# Patient Record
Sex: Female | Born: 1970
Health system: Southern US, Community
[De-identification: ages and names within clinical notes are randomized; demographics above are authoritative.]

## PROBLEM LIST (undated history)

## (undated) DIAGNOSIS — N289 Disorder of kidney and ureter, unspecified: Secondary | ICD-10-CM

## (undated) DIAGNOSIS — E039 Hypothyroidism, unspecified: Secondary | ICD-10-CM

## (undated) HISTORY — DX: Hypothyroidism, unspecified: E03.9

---

## 1999-11-05 ENCOUNTER — Inpatient Hospital Stay (HOSPITAL_COMMUNITY): Admission: AD | Admit: 1999-11-05 | Discharge: 1999-11-05 | Payer: Self-pay | Admitting: Obstetrics and Gynecology

## 1999-11-29 ENCOUNTER — Inpatient Hospital Stay (HOSPITAL_COMMUNITY): Admission: AD | Admit: 1999-11-29 | Discharge: 1999-11-29 | Payer: Self-pay | Admitting: Obstetrics and Gynecology

## 1999-12-26 ENCOUNTER — Inpatient Hospital Stay (HOSPITAL_COMMUNITY): Admission: AD | Admit: 1999-12-26 | Discharge: 1999-12-26 | Payer: Self-pay | Admitting: *Deleted

## 1999-12-29 ENCOUNTER — Inpatient Hospital Stay (HOSPITAL_COMMUNITY): Admission: AD | Admit: 1999-12-29 | Discharge: 1999-12-29 | Payer: Self-pay | Admitting: Obstetrics and Gynecology

## 1999-12-31 ENCOUNTER — Inpatient Hospital Stay (HOSPITAL_COMMUNITY): Admission: AD | Admit: 1999-12-31 | Discharge: 1999-12-31 | Payer: Self-pay | Admitting: Obstetrics and Gynecology

## 2000-01-01 ENCOUNTER — Encounter (INDEPENDENT_AMBULATORY_CARE_PROVIDER_SITE_OTHER): Payer: Self-pay | Admitting: Specialist

## 2000-01-01 ENCOUNTER — Inpatient Hospital Stay (HOSPITAL_COMMUNITY): Admission: AD | Admit: 2000-01-01 | Discharge: 2000-01-03 | Payer: Self-pay | Admitting: Obstetrics and Gynecology

## 2000-02-05 ENCOUNTER — Other Ambulatory Visit: Admission: RE | Admit: 2000-02-05 | Discharge: 2000-02-05 | Payer: Self-pay | Admitting: Obstetrics and Gynecology

## 2001-03-10 ENCOUNTER — Other Ambulatory Visit: Admission: RE | Admit: 2001-03-10 | Discharge: 2001-03-10 | Payer: Self-pay | Admitting: Obstetrics and Gynecology

## 2002-03-31 ENCOUNTER — Other Ambulatory Visit: Admission: RE | Admit: 2002-03-31 | Discharge: 2002-03-31 | Payer: Self-pay | Admitting: Obstetrics and Gynecology

## 2003-06-06 ENCOUNTER — Other Ambulatory Visit: Admission: RE | Admit: 2003-06-06 | Discharge: 2003-06-06 | Payer: Self-pay | Admitting: Obstetrics and Gynecology

## 2004-08-28 ENCOUNTER — Other Ambulatory Visit: Admission: RE | Admit: 2004-08-28 | Discharge: 2004-08-28 | Payer: Self-pay | Admitting: Obstetrics and Gynecology

## 2005-10-20 ENCOUNTER — Other Ambulatory Visit: Admission: RE | Admit: 2005-10-20 | Discharge: 2005-10-20 | Payer: Self-pay | Admitting: Family Medicine

## 2006-07-21 ENCOUNTER — Inpatient Hospital Stay (HOSPITAL_COMMUNITY): Admission: RE | Admit: 2006-07-21 | Discharge: 2006-07-22 | Payer: Self-pay | Admitting: Orthopaedic Surgery

## 2006-10-06 HISTORY — PX: OTHER SURGICAL HISTORY: SHX169

## 2008-01-26 ENCOUNTER — Other Ambulatory Visit: Admission: RE | Admit: 2008-01-26 | Discharge: 2008-01-26 | Payer: Self-pay | Admitting: Family Medicine

## 2009-06-28 ENCOUNTER — Other Ambulatory Visit: Admission: RE | Admit: 2009-06-28 | Discharge: 2009-06-28 | Payer: Self-pay | Admitting: Family Medicine

## 2010-11-06 ENCOUNTER — Other Ambulatory Visit (HOSPITAL_COMMUNITY)
Admission: RE | Admit: 2010-11-06 | Discharge: 2010-11-06 | Disposition: A | Discharge status: Home / Self Care | Payer: BC Managed Care – PPO | Source: Ambulatory Visit | Attending: Family Medicine | Admitting: Family Medicine

## 2010-11-06 ENCOUNTER — Other Ambulatory Visit: Payer: Self-pay | Admitting: Family Medicine

## 2010-11-06 DIAGNOSIS — Z0000 Encounter for general adult medical examination without abnormal findings: Secondary | ICD-10-CM | POA: Insufficient documentation

## 2011-01-23 ENCOUNTER — Other Ambulatory Visit: Payer: Self-pay | Admitting: Family Medicine

## 2011-01-23 DIAGNOSIS — Z1231 Encounter for screening mammogram for malignant neoplasm of breast: Secondary | ICD-10-CM

## 2011-02-17 ENCOUNTER — Ambulatory Visit
Admission: RE | Admit: 2011-02-17 | Discharge: 2011-02-17 | Disposition: A | Discharge status: Home / Self Care | Payer: BC Managed Care – PPO | Source: Ambulatory Visit | Attending: Family Medicine | Admitting: Family Medicine

## 2011-02-17 DIAGNOSIS — Z1231 Encounter for screening mammogram for malignant neoplasm of breast: Secondary | ICD-10-CM

## 2011-02-21 NOTE — Op Note (Signed)
NAMEJANYLAH, [name]             ACCOUNT NO.:  [id number]   MEDICAL RECORD NO.:  [id number]          PATIENT TYPE:  INP   LOCATION:  5008                         FACILITY:  [facility]   PHYSICIAN:  Sharolyn Douglas, M.D.        DATE OF BIRTH:  1971/07/02   DATE OF PROCEDURE:  07/21/2006  DATE OF DISCHARGE:  07/22/2006                                 OPERATIVE REPORT   DIAGNOSES:  1. L5-S1 disk herniation with left lower extremity radiculopathy and      lateral recess narrowing.  2. L4-5 central disk herniation and facet arthropathy with left central      and lateral recess stenosis.   PROCEDURE:  1. Minimally invasive L5-S1 hemilaminotomy and diskectomy with lateral      recess decompression.  2. L4-5 minimally invasive hemilaminotomy, diskectomy and lateral recess      decompression.   SURGEON:  Sharolyn Douglas, M.D.   ASSISTANT:  Verlin Fester, P.A.   ANESTHESIA:  General endotracheal.   ESTIMATED BLOOD LOSS:  Minimal.   COMPLICATIONS:  None.   NEEDLE AND SPONGE COUNT:  Correct.   INDICATIONS:  The patient is a pleasant 40 year old female with severe left  lower extremity radiculopathy x6 weeks.  Her imaging studies show  degenerative disk at L4-5 and L5-S1.  At L5-S1 on the left, there is a large  disk rupture into the lateral recess with severe compression of the S1 nerve  root.  There is also associated facet hypertrophy and lateral recess  narrowing.  At L4-5, she has moderate central and lateral recess stenosis  due to a central and left disk herniation.  She has failed conservative  treatment measures and now elects to undergo two-level lumbar minimally  invasive decompression in hopes of improving her symptoms.  Risks, benefits,  alternatives were reviewed.   PROCEDURE:  After informed consent, she was taken to the operating room.  She underwent general endotracheal anesthesia without difficulty, given  prophylactic IV antibiotics, very carefully positioned prone onto the  Wilson  frame, all bony prominences padded, face and eyes protected all times, back  prepped and draped in the usual sterile fashion.  Fluoroscopy was brought  into the field and a 1-inch incision was made, centered between the L4-5 and  L5-S1 interspaces in the midline.  Dissection was carried through the deep  fascia.  The dilators from the [facility] retractor system were used to dock  onto the L5-S1 interspace and this was done under fluoroscopy.  The 50-mm  blades were attached to the MaXcess retractor and the retractor was gently  slid over the final dilator.  The retractor was attached to the operating  room table and then gently spread, dilating the paraspinal muscles.  We  visually confirmed the L5-S1 interface by taking a final fluoroscopic image.  The microscope was then draped and brought into the field.  The high-speed  bur was used to remove the inferior 1/3 of the L5 lamina and medial 1/3 of  the facet joint complex.  The ligamentum flavum was elevated.  We identified  the S1 nerve root, which was  being displaced dorsally by the large disk  rupture.  There were large epidural veins.  These were carefully dissected  and coagulated with bipolar.  We were able to gently mobilize the S1 nerve  root and then enter the disk herniation with a 15 blade.  Immediately, a  large fragment extruded; this was removed with a pituitary.  We then used  the Epstein curette to decompress the remaining disk herniation back into  the interspace.  This was removed as a large fragment.  We then removed  multiple small fragments.  We explored the spinal canal and felt that the  disk herniation had been decompressed.  We then undercut the superior facet  of S1, decompressing the lateral recess.  The S1 nerve root was now  completely free from its takeoff out medial to the S1 pedicle.  Hemostasis  was achieved.  We then turned our attention to performing a similar  procedure at L4-5.  We were able to  simply change the angle of the  microscope to access the L4-5 interspace.  A high-speed bur was again used  to remove the inferior 1/3 of the L4 lamina and medial 1/3 of the facet  joint complex.  At this time, the ligamentum flavum was not only elevated,  but removed.  We were able to undercut towards the midline, decompressing  some of the central stenosis.  We then identified the lateral border of the  thecal sac and immobilized it medially.  Again, there were large epidural  veins, which were coagulated.  We identified a broad-based bulging disk,  which was entered with the 15 blade.  We had disk material extruded under  pressure; this was removed and decompressed back into the disk space with  the Epstein; multiple fragments were removed.  The disk space was irrigated.  The lateral recess decompression was again completed.  We evaluated the L5  nerve root and felt that it was well decompressed.  Exploration of the  spinal canal revealed no free fragments.  Hemostasis was achieved.  The  wound was irrigated and 2 mL of fentanyl left in the laminotomies.  The  MaXcess retractor was removed.  The wound was then closed in layers with #1  Vicryl, 2-0 Vicryl and Dermabond on the skin edges.  The patient was turned  supine, extubated without difficulty and transferred to Recovery in stable  condition, able to move her upper and lower extremities.  It should be noted  that my assistant, Verlin Fester, P.A., was present throughout the procedure  including during positioning, the exposure.  She worked under the microscope  during the decompression and she also assisted with wound closure.      Sharolyn Douglas, M.D.  Electronically Signed     MC/MEDQ  D:  07/21/2006  T:  07/23/2006  Job:  [id number]

## 2012-04-16 ENCOUNTER — Other Ambulatory Visit: Payer: Self-pay | Admitting: Family Medicine

## 2012-04-16 DIAGNOSIS — Z1231 Encounter for screening mammogram for malignant neoplasm of breast: Secondary | ICD-10-CM

## 2012-04-30 ENCOUNTER — Ambulatory Visit: Payer: BC Managed Care – PPO

## 2012-10-11 ENCOUNTER — Ambulatory Visit
Admission: RE | Admit: 2012-10-11 | Discharge: 2012-10-11 | Disposition: A | Discharge status: Home / Self Care | Payer: BC Managed Care – PPO | Source: Ambulatory Visit | Attending: Family Medicine | Admitting: Family Medicine

## 2012-10-11 DIAGNOSIS — Z1231 Encounter for screening mammogram for malignant neoplasm of breast: Secondary | ICD-10-CM

## 2013-05-25 ENCOUNTER — Other Ambulatory Visit (HOSPITAL_COMMUNITY)
Admission: RE | Admit: 2013-05-25 | Discharge: 2013-05-25 | Disposition: A | Discharge status: Home / Self Care | Payer: BC Managed Care – PPO | Source: Ambulatory Visit | Attending: Family Medicine | Admitting: Family Medicine

## 2013-05-25 ENCOUNTER — Other Ambulatory Visit: Payer: Self-pay | Admitting: Physician Assistant

## 2013-05-25 DIAGNOSIS — Z0000 Encounter for general adult medical examination without abnormal findings: Secondary | ICD-10-CM | POA: Insufficient documentation

## 2015-04-06 ENCOUNTER — Ambulatory Visit (HOSPITAL_COMMUNITY)
Admission: RE | Admit: 2015-04-06 | Discharge: 2015-04-06 | Disposition: A | Discharge status: Home / Self Care | Payer: BLUE CROSS/BLUE SHIELD | Source: Ambulatory Visit | Attending: Cardiology | Admitting: Cardiology

## 2015-04-06 ENCOUNTER — Other Ambulatory Visit: Payer: Self-pay | Admitting: Cardiology

## 2015-04-06 DIAGNOSIS — M79609 Pain in unspecified limb: Secondary | ICD-10-CM

## 2015-04-10 ENCOUNTER — Other Ambulatory Visit: Payer: Self-pay

## 2015-04-10 DIAGNOSIS — Z1231 Encounter for screening mammogram for malignant neoplasm of breast: Secondary | ICD-10-CM

## 2015-04-13 ENCOUNTER — Ambulatory Visit: Payer: BLUE CROSS/BLUE SHIELD

## 2015-12-12 ENCOUNTER — Ambulatory Visit: Payer: BLUE CROSS/BLUE SHIELD

## 2015-12-26 ENCOUNTER — Ambulatory Visit
Admission: RE | Admit: 2015-12-26 | Discharge: 2015-12-26 | Disposition: A | Discharge status: Home / Self Care | Payer: BLUE CROSS/BLUE SHIELD | Source: Ambulatory Visit

## 2015-12-26 DIAGNOSIS — Z1231 Encounter for screening mammogram for malignant neoplasm of breast: Secondary | ICD-10-CM

## 2016-01-24 DIAGNOSIS — F4323 Adjustment disorder with mixed anxiety and depressed mood: Secondary | ICD-10-CM | POA: Diagnosis not present

## 2016-02-11 DIAGNOSIS — J069 Acute upper respiratory infection, unspecified: Secondary | ICD-10-CM | POA: Diagnosis not present

## 2016-04-29 DIAGNOSIS — M1711 Unilateral primary osteoarthritis, right knee: Secondary | ICD-10-CM | POA: Diagnosis not present

## 2016-05-06 DIAGNOSIS — M1711 Unilateral primary osteoarthritis, right knee: Secondary | ICD-10-CM | POA: Diagnosis not present

## 2016-05-13 DIAGNOSIS — M1711 Unilateral primary osteoarthritis, right knee: Secondary | ICD-10-CM | POA: Diagnosis not present

## 2016-06-25 DIAGNOSIS — M1711 Unilateral primary osteoarthritis, right knee: Secondary | ICD-10-CM | POA: Diagnosis not present

## 2016-09-03 DIAGNOSIS — L811 Chloasma: Secondary | ICD-10-CM | POA: Diagnosis not present

## 2016-09-03 DIAGNOSIS — Z7189 Other specified counseling: Secondary | ICD-10-CM | POA: Diagnosis not present

## 2016-09-03 DIAGNOSIS — Z23 Encounter for immunization: Secondary | ICD-10-CM | POA: Diagnosis not present

## 2016-09-17 DIAGNOSIS — Z23 Encounter for immunization: Secondary | ICD-10-CM | POA: Diagnosis not present

## 2016-11-28 ENCOUNTER — Other Ambulatory Visit: Payer: Self-pay | Admitting: Family Medicine

## 2016-11-28 DIAGNOSIS — Z1231 Encounter for screening mammogram for malignant neoplasm of breast: Secondary | ICD-10-CM

## 2016-12-26 ENCOUNTER — Ambulatory Visit
Admission: RE | Admit: 2016-12-26 | Discharge: 2016-12-26 | Disposition: A | Discharge status: Home / Self Care | Payer: BLUE CROSS/BLUE SHIELD | Source: Ambulatory Visit | Attending: Family Medicine | Admitting: Family Medicine

## 2016-12-26 DIAGNOSIS — Z1231 Encounter for screening mammogram for malignant neoplasm of breast: Secondary | ICD-10-CM

## 2017-03-03 DIAGNOSIS — Z23 Encounter for immunization: Secondary | ICD-10-CM | POA: Diagnosis not present

## 2017-05-28 DIAGNOSIS — D2272 Melanocytic nevi of left lower limb, including hip: Secondary | ICD-10-CM | POA: Diagnosis not present

## 2017-05-28 DIAGNOSIS — D225 Melanocytic nevi of trunk: Secondary | ICD-10-CM | POA: Diagnosis not present

## 2017-05-28 DIAGNOSIS — D239 Other benign neoplasm of skin, unspecified: Secondary | ICD-10-CM | POA: Diagnosis not present

## 2017-06-16 DIAGNOSIS — N951 Menopausal and female climacteric states: Secondary | ICD-10-CM | POA: Diagnosis not present

## 2017-06-16 DIAGNOSIS — N912 Amenorrhea, unspecified: Secondary | ICD-10-CM | POA: Diagnosis not present

## 2017-07-13 DIAGNOSIS — M7711 Lateral epicondylitis, right elbow: Secondary | ICD-10-CM | POA: Diagnosis not present

## 2017-08-04 DIAGNOSIS — M1711 Unilateral primary osteoarthritis, right knee: Secondary | ICD-10-CM | POA: Diagnosis not present

## 2017-08-12 DIAGNOSIS — M1711 Unilateral primary osteoarthritis, right knee: Secondary | ICD-10-CM | POA: Diagnosis not present

## 2017-09-21 DIAGNOSIS — M7711 Lateral epicondylitis, right elbow: Secondary | ICD-10-CM | POA: Diagnosis not present

## 2017-09-21 DIAGNOSIS — M25521 Pain in right elbow: Secondary | ICD-10-CM | POA: Diagnosis not present

## 2017-10-20 ENCOUNTER — Other Ambulatory Visit: Payer: Self-pay | Admitting: Family Medicine

## 2017-10-20 ENCOUNTER — Other Ambulatory Visit (HOSPITAL_COMMUNITY)
Admission: RE | Admit: 2017-10-20 | Discharge: 2017-10-20 | Disposition: A | Discharge status: Home / Self Care | Payer: BLUE CROSS/BLUE SHIELD | Source: Ambulatory Visit | Attending: Family Medicine | Admitting: Family Medicine

## 2017-10-20 DIAGNOSIS — Z01419 Encounter for gynecological examination (general) (routine) without abnormal findings: Secondary | ICD-10-CM | POA: Diagnosis not present

## 2017-10-20 DIAGNOSIS — Z23 Encounter for immunization: Secondary | ICD-10-CM | POA: Diagnosis not present

## 2017-10-20 DIAGNOSIS — Z0000 Encounter for general adult medical examination without abnormal findings: Secondary | ICD-10-CM | POA: Diagnosis not present

## 2017-10-20 DIAGNOSIS — Z124 Encounter for screening for malignant neoplasm of cervix: Secondary | ICD-10-CM | POA: Diagnosis not present

## 2017-10-22 LAB — CYTOLOGY - PAP
ADEQUACY: ABSENT
Diagnosis: NEGATIVE

## 2018-02-22 ENCOUNTER — Other Ambulatory Visit: Payer: Self-pay | Admitting: Family Medicine

## 2018-02-22 DIAGNOSIS — Z1231 Encounter for screening mammogram for malignant neoplasm of breast: Secondary | ICD-10-CM

## 2018-02-25 ENCOUNTER — Ambulatory Visit
Admission: RE | Admit: 2018-02-25 | Discharge: 2018-02-25 | Disposition: A | Discharge status: Home / Self Care | Payer: BLUE CROSS/BLUE SHIELD | Source: Ambulatory Visit | Attending: Family Medicine | Admitting: Family Medicine

## 2018-02-25 DIAGNOSIS — Z1231 Encounter for screening mammogram for malignant neoplasm of breast: Secondary | ICD-10-CM | POA: Diagnosis not present

## 2018-05-27 DIAGNOSIS — F411 Generalized anxiety disorder: Secondary | ICD-10-CM | POA: Diagnosis not present

## 2018-07-05 DIAGNOSIS — L738 Other specified follicular disorders: Secondary | ICD-10-CM | POA: Diagnosis not present

## 2018-07-05 DIAGNOSIS — Z23 Encounter for immunization: Secondary | ICD-10-CM | POA: Diagnosis not present

## 2018-07-05 DIAGNOSIS — D2271 Melanocytic nevi of right lower limb, including hip: Secondary | ICD-10-CM | POA: Diagnosis not present

## 2018-07-05 DIAGNOSIS — D2230 Melanocytic nevi of unspecified part of face: Secondary | ICD-10-CM | POA: Diagnosis not present

## 2018-08-11 DIAGNOSIS — F411 Generalized anxiety disorder: Secondary | ICD-10-CM | POA: Diagnosis not present

## 2018-08-25 DIAGNOSIS — F411 Generalized anxiety disorder: Secondary | ICD-10-CM | POA: Diagnosis not present

## 2018-09-07 DIAGNOSIS — M25561 Pain in right knee: Secondary | ICD-10-CM | POA: Diagnosis not present

## 2018-09-07 DIAGNOSIS — M1711 Unilateral primary osteoarthritis, right knee: Secondary | ICD-10-CM | POA: Diagnosis not present

## 2018-09-10 DIAGNOSIS — F411 Generalized anxiety disorder: Secondary | ICD-10-CM | POA: Diagnosis not present

## 2018-09-14 DIAGNOSIS — M1711 Unilateral primary osteoarthritis, right knee: Secondary | ICD-10-CM | POA: Diagnosis not present

## 2018-09-20 DIAGNOSIS — F411 Generalized anxiety disorder: Secondary | ICD-10-CM | POA: Diagnosis not present

## 2018-09-21 DIAGNOSIS — M1711 Unilateral primary osteoarthritis, right knee: Secondary | ICD-10-CM | POA: Diagnosis not present

## 2018-10-04 DIAGNOSIS — F411 Generalized anxiety disorder: Secondary | ICD-10-CM | POA: Diagnosis not present

## 2018-10-25 DIAGNOSIS — F411 Generalized anxiety disorder: Secondary | ICD-10-CM | POA: Diagnosis not present

## 2018-11-08 DIAGNOSIS — F411 Generalized anxiety disorder: Secondary | ICD-10-CM | POA: Diagnosis not present

## 2018-11-22 DIAGNOSIS — F411 Generalized anxiety disorder: Secondary | ICD-10-CM | POA: Diagnosis not present

## 2019-02-21 DIAGNOSIS — F411 Generalized anxiety disorder: Secondary | ICD-10-CM | POA: Diagnosis not present

## 2019-03-28 DIAGNOSIS — Z20828 Contact with and (suspected) exposure to other viral communicable diseases: Secondary | ICD-10-CM | POA: Diagnosis not present

## 2019-04-12 DIAGNOSIS — E7211 Homocystinuria: Secondary | ICD-10-CM | POA: Diagnosis not present

## 2019-04-12 DIAGNOSIS — M810 Age-related osteoporosis without current pathological fracture: Secondary | ICD-10-CM | POA: Diagnosis not present

## 2019-04-12 DIAGNOSIS — E559 Vitamin D deficiency, unspecified: Secondary | ICD-10-CM | POA: Diagnosis not present

## 2019-04-12 DIAGNOSIS — Z79890 Hormone replacement therapy (postmenopausal): Secondary | ICD-10-CM | POA: Diagnosis not present

## 2019-04-12 DIAGNOSIS — E039 Hypothyroidism, unspecified: Secondary | ICD-10-CM | POA: Diagnosis not present

## 2019-04-12 DIAGNOSIS — N951 Menopausal and female climacteric states: Secondary | ICD-10-CM | POA: Diagnosis not present

## 2019-04-13 DIAGNOSIS — M1711 Unilateral primary osteoarthritis, right knee: Secondary | ICD-10-CM | POA: Diagnosis not present

## 2019-04-18 DIAGNOSIS — F411 Generalized anxiety disorder: Secondary | ICD-10-CM | POA: Diagnosis not present

## 2019-05-12 ENCOUNTER — Other Ambulatory Visit: Payer: Self-pay | Admitting: Family Medicine

## 2019-05-12 DIAGNOSIS — Z1231 Encounter for screening mammogram for malignant neoplasm of breast: Secondary | ICD-10-CM

## 2019-05-17 ENCOUNTER — Ambulatory Visit
Admission: RE | Admit: 2019-05-17 | Discharge: 2019-05-17 | Disposition: A | Discharge status: Home / Self Care | Payer: BC Managed Care – PPO | Source: Ambulatory Visit | Attending: Family Medicine | Admitting: Family Medicine

## 2019-05-17 ENCOUNTER — Other Ambulatory Visit: Payer: Self-pay

## 2019-05-17 DIAGNOSIS — Z1231 Encounter for screening mammogram for malignant neoplasm of breast: Secondary | ICD-10-CM

## 2019-05-31 DIAGNOSIS — M1711 Unilateral primary osteoarthritis, right knee: Secondary | ICD-10-CM | POA: Diagnosis not present

## 2019-06-02 DIAGNOSIS — H10413 Chronic giant papillary conjunctivitis, bilateral: Secondary | ICD-10-CM | POA: Diagnosis not present

## 2019-06-02 DIAGNOSIS — H5213 Myopia, bilateral: Secondary | ICD-10-CM | POA: Diagnosis not present

## 2019-06-09 DIAGNOSIS — M1711 Unilateral primary osteoarthritis, right knee: Secondary | ICD-10-CM | POA: Diagnosis not present

## 2019-06-14 DIAGNOSIS — E230 Hypopituitarism: Secondary | ICD-10-CM | POA: Diagnosis not present

## 2019-06-16 DIAGNOSIS — M1711 Unilateral primary osteoarthritis, right knee: Secondary | ICD-10-CM | POA: Diagnosis not present

## 2019-06-20 DIAGNOSIS — F411 Generalized anxiety disorder: Secondary | ICD-10-CM | POA: Diagnosis not present

## 2019-06-28 DIAGNOSIS — Z23 Encounter for immunization: Secondary | ICD-10-CM | POA: Diagnosis not present

## 2019-08-09 DIAGNOSIS — E039 Hypothyroidism, unspecified: Secondary | ICD-10-CM | POA: Diagnosis not present

## 2019-08-09 DIAGNOSIS — Z79890 Hormone replacement therapy (postmenopausal): Secondary | ICD-10-CM | POA: Diagnosis not present

## 2019-08-09 DIAGNOSIS — M810 Age-related osteoporosis without current pathological fracture: Secondary | ICD-10-CM | POA: Diagnosis not present

## 2019-08-09 DIAGNOSIS — N951 Menopausal and female climacteric states: Secondary | ICD-10-CM | POA: Diagnosis not present

## 2019-08-22 ENCOUNTER — Other Ambulatory Visit: Payer: Self-pay | Admitting: Family Medicine

## 2019-08-22 ENCOUNTER — Other Ambulatory Visit (HOSPITAL_COMMUNITY)
Admission: RE | Admit: 2019-08-22 | Discharge: 2019-08-22 | Disposition: A | Discharge status: Home / Self Care | Payer: BC Managed Care – PPO | Source: Ambulatory Visit | Attending: Family Medicine | Admitting: Family Medicine

## 2019-08-22 DIAGNOSIS — Z0000 Encounter for general adult medical examination without abnormal findings: Secondary | ICD-10-CM | POA: Insufficient documentation

## 2019-08-22 DIAGNOSIS — Z13220 Encounter for screening for lipoid disorders: Secondary | ICD-10-CM | POA: Diagnosis not present

## 2019-08-24 LAB — CYTOLOGY - PAP
Comment: NEGATIVE
Diagnosis: NEGATIVE
High risk HPV: NEGATIVE

## 2019-09-20 DIAGNOSIS — F411 Generalized anxiety disorder: Secondary | ICD-10-CM | POA: Diagnosis not present

## 2019-09-26 DIAGNOSIS — F411 Generalized anxiety disorder: Secondary | ICD-10-CM | POA: Diagnosis not present

## 2019-10-05 DIAGNOSIS — M810 Age-related osteoporosis without current pathological fracture: Secondary | ICD-10-CM | POA: Diagnosis not present

## 2019-10-05 DIAGNOSIS — E039 Hypothyroidism, unspecified: Secondary | ICD-10-CM | POA: Diagnosis not present

## 2019-10-05 DIAGNOSIS — N951 Menopausal and female climacteric states: Secondary | ICD-10-CM | POA: Diagnosis not present

## 2019-10-05 DIAGNOSIS — Z79890 Hormone replacement therapy (postmenopausal): Secondary | ICD-10-CM | POA: Diagnosis not present

## 2019-10-10 DIAGNOSIS — F411 Generalized anxiety disorder: Secondary | ICD-10-CM | POA: Diagnosis not present

## 2019-10-24 DIAGNOSIS — F411 Generalized anxiety disorder: Secondary | ICD-10-CM | POA: Diagnosis not present

## 2019-11-07 DIAGNOSIS — F411 Generalized anxiety disorder: Secondary | ICD-10-CM | POA: Diagnosis not present

## 2019-11-23 DIAGNOSIS — Z20828 Contact with and (suspected) exposure to other viral communicable diseases: Secondary | ICD-10-CM | POA: Diagnosis not present

## 2019-11-23 DIAGNOSIS — Z03818 Encounter for observation for suspected exposure to other biological agents ruled out: Secondary | ICD-10-CM | POA: Diagnosis not present

## 2019-12-02 DIAGNOSIS — N951 Menopausal and female climacteric states: Secondary | ICD-10-CM | POA: Diagnosis not present

## 2019-12-02 DIAGNOSIS — M810 Age-related osteoporosis without current pathological fracture: Secondary | ICD-10-CM | POA: Diagnosis not present

## 2019-12-02 DIAGNOSIS — Z79890 Hormone replacement therapy (postmenopausal): Secondary | ICD-10-CM | POA: Diagnosis not present

## 2019-12-05 DIAGNOSIS — F411 Generalized anxiety disorder: Secondary | ICD-10-CM | POA: Diagnosis not present

## 2019-12-10 ENCOUNTER — Ambulatory Visit: Payer: BC Managed Care – PPO | Attending: Internal Medicine

## 2019-12-10 DIAGNOSIS — Z23 Encounter for immunization: Secondary | ICD-10-CM

## 2019-12-10 NOTE — Progress Notes (Signed)
   Covid-19 Vaccination Clinic  Name:  [name]    MRN: [id number] DOB: 08/20/1971  12/10/2019  [name] was observed post Covid-19 immunization for 15 minutes without incident. She was provided with Vaccine Information Sheet and instruction to access the V-Safe system.   [name] was instructed to call 911 with any severe reactions post vaccine: Marland Kitchen Difficulty breathing  . Swelling of face and throat  . A fast heartbeat  . A bad rash all over body  . Dizziness and weakness   Immunizations Administered    Name Date Dose VIS Date Route   Pfizer COVID-19 Vaccine 12/10/2019 10:56 AM 0.3 mL 09/16/2019 Intramuscular   Manufacturer: [employer], [employer]   Lot: [id number]   NDC: [address]-5

## 2019-12-20 DIAGNOSIS — F411 Generalized anxiety disorder: Secondary | ICD-10-CM | POA: Diagnosis not present

## 2019-12-26 DIAGNOSIS — F411 Generalized anxiety disorder: Secondary | ICD-10-CM | POA: Diagnosis not present

## 2019-12-31 ENCOUNTER — Ambulatory Visit: Payer: BC Managed Care – PPO | Attending: Internal Medicine

## 2019-12-31 DIAGNOSIS — Z23 Encounter for immunization: Secondary | ICD-10-CM

## 2019-12-31 NOTE — Progress Notes (Signed)
   Covid-19 Vaccination Clinic  Name:  SHATERRA SANZONE    MRN: [id number] DOB: [birth year]  12/31/2019  Ms. Amador was observed post Covid-19 immunization for 15 minutes without incident. She was provided with Vaccine Information Sheet and instruction to access the V-Safe system.   Ms. Delavega was instructed to call 911 with any severe reactions post vaccine: Marland Kitchen Difficulty breathing  . Swelling of face and throat  . A fast heartbeat  . A bad rash all over body  . Dizziness and weakness   Immunizations Administered    Name Date Dose VIS Date Route   Pfizer COVID-19 Vaccine 12/31/2019  8:23 AM 0.3 mL 09/16/2019 Intramuscular   Manufacturer: [employer], [employer]   Lot: [id number]   NDC: [address]-3

## 2020-01-16 DIAGNOSIS — F411 Generalized anxiety disorder: Secondary | ICD-10-CM | POA: Diagnosis not present

## 2020-01-23 DIAGNOSIS — Z79890 Hormone replacement therapy (postmenopausal): Secondary | ICD-10-CM | POA: Diagnosis not present

## 2020-01-23 DIAGNOSIS — N951 Menopausal and female climacteric states: Secondary | ICD-10-CM | POA: Diagnosis not present

## 2020-01-23 DIAGNOSIS — M810 Age-related osteoporosis without current pathological fracture: Secondary | ICD-10-CM | POA: Diagnosis not present

## 2020-02-06 DIAGNOSIS — F411 Generalized anxiety disorder: Secondary | ICD-10-CM | POA: Diagnosis not present

## 2020-02-27 DIAGNOSIS — F411 Generalized anxiety disorder: Secondary | ICD-10-CM | POA: Diagnosis not present

## 2020-04-03 DIAGNOSIS — N951 Menopausal and female climacteric states: Secondary | ICD-10-CM | POA: Diagnosis not present

## 2020-04-03 DIAGNOSIS — M810 Age-related osteoporosis without current pathological fracture: Secondary | ICD-10-CM | POA: Diagnosis not present

## 2020-04-03 DIAGNOSIS — Z79890 Hormone replacement therapy (postmenopausal): Secondary | ICD-10-CM | POA: Diagnosis not present

## 2020-05-21 DIAGNOSIS — F411 Generalized anxiety disorder: Secondary | ICD-10-CM | POA: Diagnosis not present

## 2020-06-04 DIAGNOSIS — F411 Generalized anxiety disorder: Secondary | ICD-10-CM | POA: Diagnosis not present

## 2020-06-06 DIAGNOSIS — M79671 Pain in right foot: Secondary | ICD-10-CM | POA: Diagnosis not present

## 2020-06-06 DIAGNOSIS — M170 Bilateral primary osteoarthritis of knee: Secondary | ICD-10-CM | POA: Diagnosis not present

## 2020-06-18 DIAGNOSIS — F411 Generalized anxiety disorder: Secondary | ICD-10-CM | POA: Diagnosis not present

## 2020-06-22 DIAGNOSIS — N951 Menopausal and female climacteric states: Secondary | ICD-10-CM | POA: Diagnosis not present

## 2020-06-22 DIAGNOSIS — Z79890 Hormone replacement therapy (postmenopausal): Secondary | ICD-10-CM | POA: Diagnosis not present

## 2020-06-22 DIAGNOSIS — M810 Age-related osteoporosis without current pathological fracture: Secondary | ICD-10-CM | POA: Diagnosis not present

## 2020-07-02 ENCOUNTER — Other Ambulatory Visit: Payer: Self-pay | Admitting: Family Medicine

## 2020-07-02 DIAGNOSIS — F411 Generalized anxiety disorder: Secondary | ICD-10-CM | POA: Diagnosis not present

## 2020-07-02 DIAGNOSIS — Z1231 Encounter for screening mammogram for malignant neoplasm of breast: Secondary | ICD-10-CM

## 2020-07-03 ENCOUNTER — Other Ambulatory Visit: Payer: Self-pay

## 2020-07-03 ENCOUNTER — Ambulatory Visit
Admission: RE | Admit: 2020-07-03 | Discharge: 2020-07-03 | Disposition: A | Discharge status: Home / Self Care | Payer: BC Managed Care – PPO | Source: Ambulatory Visit | Attending: Family Medicine | Admitting: Family Medicine

## 2020-07-03 DIAGNOSIS — Z1231 Encounter for screening mammogram for malignant neoplasm of breast: Secondary | ICD-10-CM | POA: Diagnosis not present

## 2020-07-04 DIAGNOSIS — M1711 Unilateral primary osteoarthritis, right knee: Secondary | ICD-10-CM | POA: Diagnosis not present

## 2020-07-05 DIAGNOSIS — H524 Presbyopia: Secondary | ICD-10-CM | POA: Diagnosis not present

## 2020-07-05 DIAGNOSIS — H10413 Chronic giant papillary conjunctivitis, bilateral: Secondary | ICD-10-CM | POA: Diagnosis not present

## 2020-07-05 DIAGNOSIS — H5213 Myopia, bilateral: Secondary | ICD-10-CM | POA: Diagnosis not present

## 2020-07-11 DIAGNOSIS — M1711 Unilateral primary osteoarthritis, right knee: Secondary | ICD-10-CM | POA: Diagnosis not present

## 2020-07-16 DIAGNOSIS — F411 Generalized anxiety disorder: Secondary | ICD-10-CM | POA: Diagnosis not present

## 2020-07-18 DIAGNOSIS — M1711 Unilateral primary osteoarthritis, right knee: Secondary | ICD-10-CM | POA: Diagnosis not present

## 2020-08-06 DIAGNOSIS — F411 Generalized anxiety disorder: Secondary | ICD-10-CM | POA: Diagnosis not present

## 2020-08-27 DIAGNOSIS — F411 Generalized anxiety disorder: Secondary | ICD-10-CM | POA: Diagnosis not present

## 2020-09-10 DIAGNOSIS — F411 Generalized anxiety disorder: Secondary | ICD-10-CM | POA: Diagnosis not present

## 2020-09-24 DIAGNOSIS — F411 Generalized anxiety disorder: Secondary | ICD-10-CM | POA: Diagnosis not present

## 2020-10-08 DIAGNOSIS — E039 Hypothyroidism, unspecified: Secondary | ICD-10-CM | POA: Diagnosis not present

## 2020-10-08 DIAGNOSIS — M810 Age-related osteoporosis without current pathological fracture: Secondary | ICD-10-CM | POA: Diagnosis not present

## 2020-10-08 DIAGNOSIS — E559 Vitamin D deficiency, unspecified: Secondary | ICD-10-CM | POA: Diagnosis not present

## 2020-10-08 DIAGNOSIS — Z79890 Hormone replacement therapy (postmenopausal): Secondary | ICD-10-CM | POA: Diagnosis not present

## 2020-10-08 DIAGNOSIS — N951 Menopausal and female climacteric states: Secondary | ICD-10-CM | POA: Diagnosis not present

## 2020-10-25 DIAGNOSIS — Z23 Encounter for immunization: Secondary | ICD-10-CM | POA: Diagnosis not present

## 2020-10-25 DIAGNOSIS — Z13220 Encounter for screening for lipoid disorders: Secondary | ICD-10-CM | POA: Diagnosis not present

## 2020-10-25 DIAGNOSIS — Z0000 Encounter for general adult medical examination without abnormal findings: Secondary | ICD-10-CM | POA: Diagnosis not present

## 2021-01-24 DIAGNOSIS — M810 Age-related osteoporosis without current pathological fracture: Secondary | ICD-10-CM | POA: Diagnosis not present

## 2021-01-24 DIAGNOSIS — Z79890 Hormone replacement therapy (postmenopausal): Secondary | ICD-10-CM | POA: Diagnosis not present

## 2021-01-24 DIAGNOSIS — N951 Menopausal and female climacteric states: Secondary | ICD-10-CM | POA: Diagnosis not present

## 2021-01-31 DIAGNOSIS — D124 Benign neoplasm of descending colon: Secondary | ICD-10-CM | POA: Diagnosis not present

## 2021-01-31 DIAGNOSIS — K621 Rectal polyp: Secondary | ICD-10-CM | POA: Diagnosis not present

## 2021-01-31 DIAGNOSIS — K5730 Diverticulosis of large intestine without perforation or abscess without bleeding: Secondary | ICD-10-CM | POA: Diagnosis not present

## 2021-01-31 DIAGNOSIS — K644 Residual hemorrhoidal skin tags: Secondary | ICD-10-CM | POA: Diagnosis not present

## 2021-01-31 DIAGNOSIS — Z1211 Encounter for screening for malignant neoplasm of colon: Secondary | ICD-10-CM | POA: Diagnosis not present

## 2021-01-31 DIAGNOSIS — D122 Benign neoplasm of ascending colon: Secondary | ICD-10-CM | POA: Diagnosis not present

## 2021-01-31 DIAGNOSIS — D123 Benign neoplasm of transverse colon: Secondary | ICD-10-CM | POA: Diagnosis not present

## 2021-01-31 DIAGNOSIS — K648 Other hemorrhoids: Secondary | ICD-10-CM | POA: Diagnosis not present

## 2021-02-03 DIAGNOSIS — K5792 Diverticulitis of intestine, part unspecified, without perforation or abscess without bleeding: Secondary | ICD-10-CM

## 2021-02-03 HISTORY — DX: Diverticulitis of intestine, part unspecified, without perforation or abscess without bleeding: K57.92

## 2021-04-21 ENCOUNTER — Encounter (HOSPITAL_COMMUNITY): Payer: Self-pay

## 2021-04-21 ENCOUNTER — Emergency Department (HOSPITAL_COMMUNITY)
Admission: EM | Admit: 2021-04-21 | Discharge: 2021-04-22 | Disposition: A | Discharge status: Home / Self Care | Payer: BC Managed Care – PPO | Attending: Emergency Medicine | Admitting: Emergency Medicine

## 2021-04-21 ENCOUNTER — Other Ambulatory Visit: Payer: Self-pay

## 2021-04-21 DIAGNOSIS — K5732 Diverticulitis of large intestine without perforation or abscess without bleeding: Secondary | ICD-10-CM | POA: Insufficient documentation

## 2021-04-21 DIAGNOSIS — K5792 Diverticulitis of intestine, part unspecified, without perforation or abscess without bleeding: Secondary | ICD-10-CM | POA: Diagnosis not present

## 2021-04-21 DIAGNOSIS — R109 Unspecified abdominal pain: Secondary | ICD-10-CM | POA: Diagnosis not present

## 2021-04-21 DIAGNOSIS — R1030 Lower abdominal pain, unspecified: Secondary | ICD-10-CM | POA: Diagnosis not present

## 2021-04-21 HISTORY — DX: Disorder of kidney and ureter, unspecified: N28.9

## 2021-04-21 NOTE — ED Triage Notes (Signed)
Patient arrived with complaints of lower abdominal pain that started yesterday but states she has been feeling bloated since Friday. Reports taking a stool softener and able to have multiple bowel movements but no relief from pain.

## 2021-04-22 ENCOUNTER — Emergency Department (HOSPITAL_COMMUNITY): Payer: BC Managed Care – PPO

## 2021-04-22 ENCOUNTER — Encounter (HOSPITAL_COMMUNITY): Payer: Self-pay

## 2021-04-22 DIAGNOSIS — R109 Unspecified abdominal pain: Secondary | ICD-10-CM | POA: Diagnosis not present

## 2021-04-22 DIAGNOSIS — K5792 Diverticulitis of intestine, part unspecified, without perforation or abscess without bleeding: Secondary | ICD-10-CM | POA: Diagnosis not present

## 2021-04-22 LAB — CBC
HCT: 40.5 % (ref 36.0–46.0)
Hemoglobin: 13.7 g/dL (ref 12.0–15.0)
MCH: 32.1 pg (ref 26.0–34.0)
MCHC: 33.8 g/dL (ref 30.0–36.0)
MCV: 94.8 fL (ref 80.0–100.0)
Platelets: 245 10*3/uL (ref 150–400)
RBC: 4.27 MIL/uL (ref 3.87–5.11)
RDW: 13 % (ref 11.5–15.5)
WBC: 12.2 10*3/uL — ABNORMAL HIGH (ref 4.0–10.5)
nRBC: 0 % (ref 0.0–0.2)

## 2021-04-22 LAB — COMPREHENSIVE METABOLIC PANEL
ALT: 18 U/L (ref 0–44)
AST: 27 U/L (ref 15–41)
Albumin: 4.5 g/dL (ref 3.5–5.0)
Alkaline Phosphatase: 71 U/L (ref 38–126)
Anion gap: 9 (ref 5–15)
BUN: 17 mg/dL (ref 6–20)
CO2: 23 mmol/L (ref 22–32)
Calcium: 9.2 mg/dL (ref 8.9–10.3)
Chloride: 102 mmol/L (ref 98–111)
Creatinine, Ser: 1.03 mg/dL — ABNORMAL HIGH (ref 0.44–1.00)
GFR, Estimated: >60 mL/min (ref >60)
Glucose, Bld: 129 mg/dL — ABNORMAL HIGH (ref 70–99)
Potassium: 3.4 mmol/L — ABNORMAL LOW (ref 3.5–5.1)
Sodium: 134 mmol/L — ABNORMAL LOW (ref 135–145)
Total Bilirubin: 0.5 mg/dL (ref 0.3–1.2)
Total Protein: 7.9 g/dL (ref 6.5–8.1)

## 2021-04-22 LAB — LIPASE, BLOOD: Lipase: 44 U/L (ref 11–51)

## 2021-04-22 LAB — LACTIC ACID, PLASMA: Lactic Acid, Venous: 1.3 mmol/L (ref 0.5–1.9)

## 2021-04-22 MED ORDER — IOHEXOL 350 MG/ML SOLN
80.0000 mL | Freq: Once | INTRAVENOUS | Status: AC | PRN
  Administered 2021-04-22: 80 mL via INTRAVENOUS

## 2021-04-22 MED ORDER — HYDROMORPHONE HCL 1 MG/ML IJ SOLN
1.0000 mg | Freq: Once | INTRAMUSCULAR | Status: AC
  Administered 2021-04-22: 1 mg via INTRAVENOUS
  Filled 2021-04-22: qty 1

## 2021-04-22 MED ORDER — ONDANSETRON 4 MG PO TBDP: 4.0000 mg | ORAL_TABLET | Freq: Three times a day (TID) | ORAL | 0 refills | Status: DC | PRN

## 2021-04-22 MED ORDER — HYDROCODONE-ACETAMINOPHEN 5-325 MG PO TABS: 1.0000 | ORAL_TABLET | ORAL | 0 refills | Status: DC | PRN

## 2021-04-22 MED ORDER — METRONIDAZOLE 500 MG/100ML IV SOLN
500.0000 mg | Freq: Once | INTRAVENOUS | Status: AC
  Administered 2021-04-22: 500 mg via INTRAVENOUS
  Filled 2021-04-22: qty 100

## 2021-04-22 MED ORDER — OXYCODONE-ACETAMINOPHEN 5-325 MG PO TABS
1.0000 | ORAL_TABLET | Freq: Once | ORAL | Status: AC
  Administered 2021-04-22: 1 via ORAL
  Filled 2021-04-22: qty 1

## 2021-04-22 MED ORDER — AMOXICILLIN-POT CLAVULANATE 875-125 MG PO TABS: 1.0000 | ORAL_TABLET | Freq: Two times a day (BID) | ORAL | 0 refills | Status: DC

## 2021-04-22 MED ORDER — SODIUM CHLORIDE 0.9 % IV BOLUS
1000.0000 mL | Freq: Once | INTRAVENOUS | Status: AC
  Administered 2021-04-22: 1000 mL via INTRAVENOUS

## 2021-04-22 MED ORDER — SODIUM CHLORIDE 0.9 % IV SOLN
1.0000 g | Freq: Once | INTRAVENOUS | Status: AC
  Administered 2021-04-22: 1 g via INTRAVENOUS
  Filled 2021-04-22: qty 10

## 2021-04-22 MED ORDER — ONDANSETRON HCL 4 MG/2ML IJ SOLN
4.0000 mg | Freq: Once | INTRAMUSCULAR | Status: AC
  Administered 2021-04-22: 4 mg via INTRAVENOUS
  Filled 2021-04-22: qty 2

## 2021-04-22 NOTE — ED Notes (Signed)
Patient provided with ginger ale for PO challenge.

## 2021-04-22 NOTE — ED Provider Notes (Signed)
[facility]   CSN: [id number] Arrival date & time: 04/21/21  2344     History Chief Complaint  Patient presents with   Abdominal Pain    [name] is a 50 y.o. female.  Patient with lower abdominal pain, bloating for the past 3 days.  Believes she was constipated yesterday and took some Dulcolax was "cleaned me out like a colonoscopy".  States she had multiple bowel movements today that were loose but nonbloody.  Has been moving her bowels every day.  Describes crampy lower abdominal pain that comes and goes in waves.  Feels like she is bloated but no longer constipated.  Nausea but no vomiting.  Still wanting to eat.  Borderline fever at home around 100.  No pain with urination or blood in the urine.  Recent colonoscopy showed several polyps which she needs follow-up for in 3 years.  No previous abdominal surgeries.  No travel or sick contacts.  No chest pain or shortness of breath.  She has never had this kind of pain before  The history is provided by the patient.  Abdominal Pain Associated symptoms: fever, nausea and vomiting   Associated symptoms: no chest pain, no cough, no dysuria, no hematuria and no shortness of breath       Past Medical History:  Diagnosis Date   Kidney disease     There are no problems to display for this patient.   History reviewed. No pertinent surgical history.   OB History   No obstetric history on file.     History reviewed. No pertinent family history.     Home Medications Prior to Admission medications   Not on File    Allergies    Patient has no known allergies.  Review of Systems   Review of Systems  Constitutional:  Positive for activity change, appetite change and fever.  HENT:  Negative for congestion, drooling, nosebleeds and rhinorrhea.   Respiratory:  Negative for cough, chest tightness and shortness of breath.   Cardiovascular:  Negative for chest pain  and leg swelling.  Gastrointestinal:  Positive for abdominal pain, nausea and vomiting.  Genitourinary:  Negative for dysuria and hematuria.  Musculoskeletal:  Negative for arthralgias and myalgias.  Skin:  Negative for rash.  Neurological:  Negative for dizziness, weakness and headaches.   all other systems are negative except as noted in the HPI and PMH.   Physical Exam Updated Vital Signs BP 115/68 (BP Location: Left Arm)   Pulse (!) 106   Temp 99.3 F (37.4 C) (Oral)   Resp 15   Ht 5' 4.5" (1.638 m)   Wt 65.8 kg   SpO2 99%   BMI 24.50 kg/m   Physical Exam Vitals and nursing note reviewed.  Constitutional:      General: She is not in acute distress.    Appearance: She is well-developed.  HENT:     Head: Normocephalic and atraumatic.     Mouth/Throat:     Pharynx: No oropharyngeal exudate.  Eyes:     Conjunctiva/sclera: Conjunctivae normal.     Pupils: Pupils are equal, round, and reactive to light.  Neck:     Comments: No meningismus. Cardiovascular:     Rate and Rhythm: Normal rate and regular rhythm.     Heart sounds: Normal heart sounds. No murmur heard. Pulmonary:     Effort: Pulmonary effort is normal. No respiratory distress.     Breath sounds: Normal breath sounds.  Abdominal:     Palpations: Abdomen is soft.     Tenderness: There is abdominal tenderness. There is guarding. There is no rebound.     Comments: Left lower quadrant periumbilical tenderness with guarding.  Abdomen is soft  Musculoskeletal:        General: No tenderness. Normal range of motion.     Cervical back: Normal range of motion and neck supple.     Comments: No CVA tenderness  Skin:    General: Skin is warm.  Neurological:     Mental Status: She is alert and oriented to person, place, and time.     Cranial Nerves: No cranial nerve deficit.     Motor: No abnormal muscle tone.     Coordination: Coordination normal.     Comments:  5/5 strength throughout. CN 2-12 intact.Equal grip  strength.   Psychiatric:        Behavior: Behavior normal.    ED Results / Procedures / Treatments   Labs (all labs ordered are listed, but only abnormal results are displayed) Labs Reviewed  COMPREHENSIVE METABOLIC PANEL - Abnormal; Notable for the following components:      Result Value   Sodium 134 (*)    Potassium 3.4 (*)    Glucose, Bld 129 (*)    Creatinine, Ser 1.03 (*)    All other components within normal limits  CBC - Abnormal; Notable for the following components:   WBC 12.2 (*)    All other components within normal limits  LIPASE, BLOOD  LACTIC ACID, PLASMA  URINALYSIS, ROUTINE W REFLEX MICROSCOPIC  LACTIC ACID, PLASMA    EKG None  Radiology No results found.  Procedures Procedures   Medications Ordered in ED Medications  sodium chloride 0.9 % bolus 1,000 mL (has no administration in time range)  HYDROmorphone (DILAUDID) injection 1 mg (has no administration in time range)  ondansetron (ZOFRAN) injection 4 mg (has no administration in time range)    ED Course  I have reviewed the triage vital signs and the nursing notes.  Pertinent labs & imaging results that were available during my care of the patient were reviewed by me and considered in my medical decision making (see chart for details).    MDM Rules/Calculators/A&P                         Lower abdominal tenderness with bloating for the past 3 days.  Did not improve with stool softeners at home.  Will obtain labs, imaging, treat symptoms.  Concern for possible diverticulitis or colitis.  Labs reassuring, lactate is normal, mild leukocytosis of 12.3.  CT scan shows uncomplicated diverticulitis/colitis.  Patient's pain and nausea is well controlled.  She is tolerating p.o. well.  Abdomen soft on recheck.  Patient is comfortable going home.  We will give antibiotics, pain and nausea medications for home use. Return to the ED with worsening pain, fever, vomiting, other concerns Final Clinical  Impression(s) / ED Diagnoses Final diagnoses:  Diverticulitis    Rx / DC Orders ED Discharge Orders     None        Kihanna Kamiya, Jeannett Senior, MD 04/22/21 [phone]

## 2021-04-22 NOTE — Discharge Instructions (Signed)
Take the antibiotics as prescribed.  Do not take the pain medications if you are working or driving.  Follow-up with your doctor.  Return to the ED with worsening pain, fever, vomiting, any other concerns.

## 2021-04-24 DIAGNOSIS — K5732 Diverticulitis of large intestine without perforation or abscess without bleeding: Secondary | ICD-10-CM | POA: Diagnosis not present

## 2021-04-24 DIAGNOSIS — N951 Menopausal and female climacteric states: Secondary | ICD-10-CM | POA: Diagnosis not present

## 2021-05-15 DIAGNOSIS — N951 Menopausal and female climacteric states: Secondary | ICD-10-CM | POA: Diagnosis not present

## 2021-07-09 DIAGNOSIS — H10413 Chronic giant papillary conjunctivitis, bilateral: Secondary | ICD-10-CM | POA: Diagnosis not present

## 2021-07-09 DIAGNOSIS — H5213 Myopia, bilateral: Secondary | ICD-10-CM | POA: Diagnosis not present

## 2021-07-09 DIAGNOSIS — H524 Presbyopia: Secondary | ICD-10-CM | POA: Diagnosis not present

## 2021-07-31 DIAGNOSIS — F4323 Adjustment disorder with mixed anxiety and depressed mood: Secondary | ICD-10-CM | POA: Diagnosis not present

## 2021-08-02 ENCOUNTER — Other Ambulatory Visit: Payer: Self-pay | Admitting: Family Medicine

## 2021-08-02 DIAGNOSIS — Z1231 Encounter for screening mammogram for malignant neoplasm of breast: Secondary | ICD-10-CM

## 2021-08-12 DIAGNOSIS — F4323 Adjustment disorder with mixed anxiety and depressed mood: Secondary | ICD-10-CM | POA: Diagnosis not present

## 2021-08-13 DIAGNOSIS — E039 Hypothyroidism, unspecified: Secondary | ICD-10-CM | POA: Diagnosis not present

## 2021-08-13 DIAGNOSIS — R5383 Other fatigue: Secondary | ICD-10-CM | POA: Diagnosis not present

## 2021-08-13 DIAGNOSIS — Z79890 Hormone replacement therapy (postmenopausal): Secondary | ICD-10-CM | POA: Diagnosis not present

## 2021-08-13 DIAGNOSIS — N950 Postmenopausal bleeding: Secondary | ICD-10-CM | POA: Diagnosis not present

## 2021-08-21 DIAGNOSIS — Z79890 Hormone replacement therapy (postmenopausal): Secondary | ICD-10-CM | POA: Diagnosis not present

## 2021-08-21 DIAGNOSIS — N939 Abnormal uterine and vaginal bleeding, unspecified: Secondary | ICD-10-CM | POA: Diagnosis not present

## 2021-09-02 DIAGNOSIS — F4323 Adjustment disorder with mixed anxiety and depressed mood: Secondary | ICD-10-CM | POA: Diagnosis not present

## 2021-09-10 ENCOUNTER — Ambulatory Visit
Admission: RE | Admit: 2021-09-10 | Discharge: 2021-09-10 | Disposition: A | Discharge status: Home / Self Care | Payer: BC Managed Care – PPO | Source: Ambulatory Visit | Attending: Family Medicine | Admitting: Family Medicine

## 2021-09-10 DIAGNOSIS — Z1231 Encounter for screening mammogram for malignant neoplasm of breast: Secondary | ICD-10-CM

## 2021-09-16 DIAGNOSIS — F4323 Adjustment disorder with mixed anxiety and depressed mood: Secondary | ICD-10-CM | POA: Diagnosis not present

## 2021-10-14 DIAGNOSIS — F4323 Adjustment disorder with mixed anxiety and depressed mood: Secondary | ICD-10-CM | POA: Diagnosis not present

## 2021-10-31 DIAGNOSIS — F4323 Adjustment disorder with mixed anxiety and depressed mood: Secondary | ICD-10-CM | POA: Diagnosis not present

## 2021-11-06 DIAGNOSIS — F4323 Adjustment disorder with mixed anxiety and depressed mood: Secondary | ICD-10-CM | POA: Diagnosis not present

## 2021-11-22 DIAGNOSIS — F4323 Adjustment disorder with mixed anxiety and depressed mood: Secondary | ICD-10-CM | POA: Diagnosis not present

## 2021-11-26 DIAGNOSIS — F4323 Adjustment disorder with mixed anxiety and depressed mood: Secondary | ICD-10-CM | POA: Diagnosis not present

## 2021-12-02 DIAGNOSIS — F4323 Adjustment disorder with mixed anxiety and depressed mood: Secondary | ICD-10-CM | POA: Diagnosis not present

## 2021-12-05 DIAGNOSIS — Z0000 Encounter for general adult medical examination without abnormal findings: Secondary | ICD-10-CM | POA: Diagnosis not present

## 2021-12-05 DIAGNOSIS — N189 Chronic kidney disease, unspecified: Secondary | ICD-10-CM | POA: Diagnosis not present

## 2021-12-05 DIAGNOSIS — N951 Menopausal and female climacteric states: Secondary | ICD-10-CM | POA: Diagnosis not present

## 2021-12-05 DIAGNOSIS — Z13220 Encounter for screening for lipoid disorders: Secondary | ICD-10-CM | POA: Diagnosis not present

## 2021-12-06 DIAGNOSIS — F4323 Adjustment disorder with mixed anxiety and depressed mood: Secondary | ICD-10-CM | POA: Diagnosis not present

## 2021-12-19 DIAGNOSIS — F4323 Adjustment disorder with mixed anxiety and depressed mood: Secondary | ICD-10-CM | POA: Diagnosis not present

## 2021-12-24 ENCOUNTER — Encounter: Payer: Self-pay | Admitting: Radiology

## 2021-12-24 ENCOUNTER — Other Ambulatory Visit: Payer: Self-pay

## 2021-12-24 ENCOUNTER — Ambulatory Visit: Payer: BC Managed Care – PPO | Admitting: Radiology

## 2021-12-24 VITALS — Ht 64.5 in | Wt 135.0 lb

## 2021-12-24 DIAGNOSIS — E079 Disorder of thyroid, unspecified: Secondary | ICD-10-CM | POA: Diagnosis not present

## 2021-12-24 DIAGNOSIS — Z780 Asymptomatic menopausal state: Secondary | ICD-10-CM | POA: Insufficient documentation

## 2021-12-24 DIAGNOSIS — N189 Chronic kidney disease, unspecified: Secondary | ICD-10-CM | POA: Insufficient documentation

## 2021-12-24 DIAGNOSIS — K5792 Diverticulitis of intestine, part unspecified, without perforation or abscess without bleeding: Secondary | ICD-10-CM | POA: Insufficient documentation

## 2021-12-24 DIAGNOSIS — K5730 Diverticulosis of large intestine without perforation or abscess without bleeding: Secondary | ICD-10-CM | POA: Insufficient documentation

## 2021-12-24 DIAGNOSIS — N912 Amenorrhea, unspecified: Secondary | ICD-10-CM | POA: Insufficient documentation

## 2021-12-24 NOTE — Progress Notes (Signed)
? ? ? ?  [name] 1970/10/15 [id number] ? ? ?History: Postmenopausal 51 y.o. presents for management of thyroid dysfunction. She was started on NP thyroid 30mg  in 2022. Here PCP checked her fasting labs 2 weeks ago and her thyroid was <0.01. She has noticed some weight loss, trouble sleeping and anxiety lately. ?She is otherwise doing well on her estradiol patch and micronized progesterone.  ?Gynecologic History ?Postmenopausal ?Last Pap: 2022. Results were: normal ?Last mammogram: 2022. Results were: normal ?HRT use: current ? ?Obstetric History ?OB History  ?Gravida Para Term Preterm AB Living  ?2 2       3   ?SAB IAB Ectopic Multiple Live Births  ?      1 3  ?  ?# Outcome Date GA Lbr Len/2nd Weight Sex Delivery Anes PTL Lv  ?2 Para           ?1A Para           ?1B Para           ? ? ? ?The following portions of the patient's history were reviewed and updated as appropriate: allergies, current medications, past family history, past medical history, past social history, past surgical history, and problem list. ? ?Review of Systems ?Pertinent items noted in HPI and remainder of comprehensive ROS otherwise negative.  ?Past medical history, past surgical history, family history and social history were all reviewed and documented in the EPIC chart. ? ?Exam: ? ?Vitals:  ? 12/24/21 1520  ?Weight: 135 lb (61.2 kg)  ?Height: 5' 4.5" (1.638 m)  ? ?Body mass index is 22.81 kg/m?. ? ?General appearance:  Normal ?Thyroid:  Symmetrical, normal in size, without palpable masses or nodularity. ?Respiratory ? Auscultation:  Clear without wheezing or rhonchi ?Cardiovascular ? Auscultation:  Regular rate, without rubs, murmurs or gallops ? Edema/varicosities:  Not grossly evident ?Abdominal ? Soft,nontender, without masses, guarding or rebound. ? Liver/spleen:  No organomegaly noted ? Hernia:  None appreciated ? Skin ? Inspection:  Grossly normal ? ? ?Assessment/Plan:   ?1. Thyroid dysfunction ?Cut dose back to 15mg  daily  M-S, skip Sunday. Will recheck in 6 weeks ?- Thyroid Panel With TSH; Future ?  ? ?AEX due 9/23 ? ?[name] B WHNP-BC, 3:50 PM 12/24/2021  ?

## 2021-12-30 DIAGNOSIS — F4323 Adjustment disorder with mixed anxiety and depressed mood: Secondary | ICD-10-CM | POA: Diagnosis not present

## 2022-01-14 DIAGNOSIS — F4323 Adjustment disorder with mixed anxiety and depressed mood: Secondary | ICD-10-CM | POA: Diagnosis not present

## 2022-02-03 DIAGNOSIS — F4323 Adjustment disorder with mixed anxiety and depressed mood: Secondary | ICD-10-CM | POA: Diagnosis not present

## 2022-02-04 ENCOUNTER — Other Ambulatory Visit: Payer: BC Managed Care – PPO

## 2022-02-04 DIAGNOSIS — E079 Disorder of thyroid, unspecified: Secondary | ICD-10-CM | POA: Diagnosis not present

## 2022-02-05 ENCOUNTER — Other Ambulatory Visit: Payer: Self-pay

## 2022-02-05 DIAGNOSIS — D2261 Melanocytic nevi of right upper limb, including shoulder: Secondary | ICD-10-CM | POA: Diagnosis not present

## 2022-02-05 DIAGNOSIS — E079 Disorder of thyroid, unspecified: Secondary | ICD-10-CM

## 2022-02-05 DIAGNOSIS — D2262 Melanocytic nevi of left upper limb, including shoulder: Secondary | ICD-10-CM | POA: Diagnosis not present

## 2022-02-05 DIAGNOSIS — D225 Melanocytic nevi of trunk: Secondary | ICD-10-CM | POA: Diagnosis not present

## 2022-02-05 DIAGNOSIS — D485 Neoplasm of uncertain behavior of skin: Secondary | ICD-10-CM | POA: Diagnosis not present

## 2022-02-05 DIAGNOSIS — I788 Other diseases of capillaries: Secondary | ICD-10-CM | POA: Diagnosis not present

## 2022-02-05 LAB — THYROID PANEL WITH TSH
Free Thyroxine Index: 1.8 (ref 1.4–3.8)
T3 Uptake: 29 % (ref 22–35)
T4, Total: 6.1 ug/dL (ref 5.1–11.9)
TSH: 4.11 m[IU]/L

## 2022-02-11 DIAGNOSIS — F4323 Adjustment disorder with mixed anxiety and depressed mood: Secondary | ICD-10-CM | POA: Diagnosis not present

## 2022-02-17 DIAGNOSIS — F4323 Adjustment disorder with mixed anxiety and depressed mood: Secondary | ICD-10-CM | POA: Diagnosis not present

## 2022-02-19 DIAGNOSIS — H00015 Hordeolum externum left lower eyelid: Secondary | ICD-10-CM | POA: Diagnosis not present

## 2022-02-19 DIAGNOSIS — H01005 Unspecified blepharitis left lower eyelid: Secondary | ICD-10-CM | POA: Diagnosis not present

## 2022-02-19 DIAGNOSIS — H01002 Unspecified blepharitis right lower eyelid: Secondary | ICD-10-CM | POA: Diagnosis not present

## 2022-02-19 DIAGNOSIS — D23112 Other benign neoplasm of skin of right lower eyelid, including canthus: Secondary | ICD-10-CM | POA: Diagnosis not present

## 2022-03-04 DIAGNOSIS — F4323 Adjustment disorder with mixed anxiety and depressed mood: Secondary | ICD-10-CM | POA: Diagnosis not present

## 2022-03-17 DIAGNOSIS — F4323 Adjustment disorder with mixed anxiety and depressed mood: Secondary | ICD-10-CM | POA: Diagnosis not present

## 2022-03-19 ENCOUNTER — Other Ambulatory Visit: Payer: Self-pay | Admitting: *Deleted

## 2022-03-19 ENCOUNTER — Other Ambulatory Visit: Payer: BC Managed Care – PPO

## 2022-03-19 DIAGNOSIS — E079 Disorder of thyroid, unspecified: Secondary | ICD-10-CM

## 2022-03-20 ENCOUNTER — Other Ambulatory Visit: Payer: Self-pay | Admitting: Radiology

## 2022-03-20 LAB — THYROID PANEL WITH TSH
Free Thyroxine Index: 2.3 (ref 1.4–3.8)
T3 Uptake: 30 % (ref 22–35)
T4, Total: 7.5 ug/dL (ref 5.1–11.9)
TSH: 3.25 mIU/L

## 2022-03-20 MED ORDER — THYROID 15 MG PO TABS: 15.0000 mg | ORAL_TABLET | Freq: Every day | ORAL | 2 refills | Status: DC

## 2022-03-31 DIAGNOSIS — F4323 Adjustment disorder with mixed anxiety and depressed mood: Secondary | ICD-10-CM | POA: Diagnosis not present

## 2022-04-14 DIAGNOSIS — F4323 Adjustment disorder with mixed anxiety and depressed mood: Secondary | ICD-10-CM | POA: Diagnosis not present

## 2022-04-17 DIAGNOSIS — K219 Gastro-esophageal reflux disease without esophagitis: Secondary | ICD-10-CM | POA: Diagnosis not present

## 2022-04-28 DIAGNOSIS — F4323 Adjustment disorder with mixed anxiety and depressed mood: Secondary | ICD-10-CM | POA: Diagnosis not present

## 2022-04-30 ENCOUNTER — Other Ambulatory Visit: Payer: Self-pay | Admitting: Radiology

## 2022-04-30 DIAGNOSIS — Z79890 Hormone replacement therapy (postmenopausal): Secondary | ICD-10-CM

## 2022-04-30 MED ORDER — ESTRADIOL 0.075 MG/24HR TD PTTW: 1.0000 | MEDICATED_PATCH | TRANSDERMAL | 12 refills | Status: DC

## 2022-05-01 MED ORDER — ESTRADIOL 0.075 MG/24HR TD PTTW: 1.0000 | MEDICATED_PATCH | TRANSDERMAL | 3 refills | Status: DC

## 2022-05-01 NOTE — Telephone Encounter (Signed)
Yes, thank you.

## 2022-05-01 NOTE — Telephone Encounter (Signed)
Jami,NP, [facility] usually prefers 90 days supply Rx. Ok?

## 2022-05-12 DIAGNOSIS — F4323 Adjustment disorder with mixed anxiety and depressed mood: Secondary | ICD-10-CM | POA: Diagnosis not present

## 2022-05-26 DIAGNOSIS — F4323 Adjustment disorder with mixed anxiety and depressed mood: Secondary | ICD-10-CM | POA: Diagnosis not present

## 2022-06-11 DIAGNOSIS — F4323 Adjustment disorder with mixed anxiety and depressed mood: Secondary | ICD-10-CM | POA: Diagnosis not present

## 2022-06-24 DIAGNOSIS — F4323 Adjustment disorder with mixed anxiety and depressed mood: Secondary | ICD-10-CM | POA: Diagnosis not present

## 2022-07-21 DIAGNOSIS — D23122 Other benign neoplasm of skin of left lower eyelid, including canthus: Secondary | ICD-10-CM | POA: Diagnosis not present

## 2022-07-21 DIAGNOSIS — H5213 Myopia, bilateral: Secondary | ICD-10-CM | POA: Diagnosis not present

## 2022-07-22 DIAGNOSIS — F4323 Adjustment disorder with mixed anxiety and depressed mood: Secondary | ICD-10-CM | POA: Diagnosis not present

## 2022-08-04 DIAGNOSIS — F4323 Adjustment disorder with mixed anxiety and depressed mood: Secondary | ICD-10-CM | POA: Diagnosis not present

## 2022-08-18 DIAGNOSIS — F4323 Adjustment disorder with mixed anxiety and depressed mood: Secondary | ICD-10-CM | POA: Diagnosis not present

## 2022-09-03 DIAGNOSIS — F4323 Adjustment disorder with mixed anxiety and depressed mood: Secondary | ICD-10-CM | POA: Diagnosis not present

## 2022-09-08 ENCOUNTER — Other Ambulatory Visit: Payer: Self-pay | Admitting: Family Medicine

## 2022-09-08 ENCOUNTER — Other Ambulatory Visit: Payer: Self-pay | Admitting: Radiology

## 2022-09-08 DIAGNOSIS — Z1231 Encounter for screening mammogram for malignant neoplasm of breast: Secondary | ICD-10-CM

## 2022-09-09 ENCOUNTER — Other Ambulatory Visit: Payer: Self-pay | Admitting: *Deleted

## 2022-09-09 MED ORDER — PROGESTERONE 200 MG PO CAPS: 200.0000 mg | ORAL_CAPSULE | Freq: Every day | ORAL | 0 refills | Status: DC

## 2022-09-09 NOTE — Telephone Encounter (Signed)
RX refill sent to Provider  for approval

## 2022-09-09 NOTE — Telephone Encounter (Signed)
RX request Progesterone 200 mg  Last OV  12/24/2021 Routing to Provider for approval

## 2022-09-15 DIAGNOSIS — F4323 Adjustment disorder with mixed anxiety and depressed mood: Secondary | ICD-10-CM | POA: Diagnosis not present

## 2022-09-16 ENCOUNTER — Ambulatory Visit (INDEPENDENT_AMBULATORY_CARE_PROVIDER_SITE_OTHER): Payer: BC Managed Care – PPO | Admitting: Radiology

## 2022-09-16 ENCOUNTER — Encounter: Payer: Self-pay | Admitting: Radiology

## 2022-09-16 VITALS — BP 104/68 | Ht 64.5 in | Wt 144.0 lb

## 2022-09-16 DIAGNOSIS — F419 Anxiety disorder, unspecified: Secondary | ICD-10-CM

## 2022-09-16 DIAGNOSIS — Z79890 Hormone replacement therapy (postmenopausal): Secondary | ICD-10-CM

## 2022-09-16 DIAGNOSIS — E039 Hypothyroidism, unspecified: Secondary | ICD-10-CM | POA: Diagnosis not present

## 2022-09-16 DIAGNOSIS — Z01419 Encounter for gynecological examination (general) (routine) without abnormal findings: Secondary | ICD-10-CM | POA: Diagnosis not present

## 2022-09-16 MED ORDER — ESCITALOPRAM OXALATE 5 MG PO TABS: 5.0000 mg | ORAL_TABLET | Freq: Every day | ORAL | 0 refills | Status: DC

## 2022-09-16 MED ORDER — ESTRADIOL 0.1 MG/24HR TD PTTW: 1.0000 | MEDICATED_PATCH | TRANSDERMAL | 4 refills | Status: DC

## 2022-09-16 MED ORDER — THYROID 15 MG PO TABS: 15.0000 mg | ORAL_TABLET | Freq: Every day | ORAL | 4 refills | Status: AC

## 2022-09-16 MED ORDER — PROGESTERONE 200 MG PO CAPS: 200.0000 mg | ORAL_CAPSULE | Freq: Every day | ORAL | 4 refills | Status: DC

## 2022-09-16 NOTE — Progress Notes (Signed)
[name] 13-Sep-1971 [id number]   History:  51 y.o. G2P2 presents for annual exam. Would like to increase estrogen to help with energy and sleep. Worsening anxiety with racing thoughts at times, interested in treating it, sees a therapist regularly.   Gynecologic History No LMP recorded. Patient is postmenopausal.   Contraception/Family planning: post menopausal status Sexually active: yes Last Pap: 2021. Results were: normal Last mammogram: 12/62022. Results were: normal, scheduled in 3 weeks  Obstetric History OB History  Gravida Para Term Preterm AB Living  2 2       3   SAB IAB Ectopic Multiple Live Births        1 3    # Outcome Date GA Lbr Len/2nd Weight Sex Delivery Anes PTL Lv  2 Para           1A Para           1B Para              The following portions of the patient's history were reviewed and updated as appropriate: allergies, current medications, past family history, past medical history, past social history, past surgical history, and problem list.  Review of Systems Pertinent items noted in HPI and remainder of comprehensive ROS otherwise negative.   Past medical history, past surgical history, family history and social history were all reviewed and documented in the EPIC chart.   Exam:  Vitals:   09/16/22 1429  BP: 104/68  Weight: 144 lb (65.3 kg)  Height: 5' 4.5" (1.638 m)   Body mass index is 24.34 kg/m.  General appearance:  Normal Thyroid:  Symmetrical, normal in size, without palpable masses or nodularity. Respiratory  Auscultation:  Clear without wheezing or rhonchi Cardiovascular  Auscultation:  Regular rate, without rubs, murmurs or gallops  Edema/varicosities:  Not grossly evident Abdominal  Soft,nontender, without masses, guarding or rebound.  Liver/spleen:  No organomegaly noted  Hernia:  None appreciated  Skin  Inspection:  Grossly normal Breasts: Examined lying and sitting.   Right: Without masses,  retractions, nipple discharge or axillary adenopathy.   Left: Without masses, retractions, nipple discharge or axillary adenopathy. Genitourinary   Inguinal/mons:  Normal without inguinal adenopathy  External genitalia:  Normal appearing vulva with no masses, tenderness, or lesions  BUS/Urethra/Skene's glands:  Normal without masses or exudate  Vagina:  Normal appearing with normal color and discharge, no lesions  Cervix:  Normal appearing without discharge or lesions  Uterus:  Normal in size, shape and contour.  Mobile, nontender  Adnexa/parametria:     Rt: Normal in size, without masses or tenderness.   Lt: Normal in size, without masses or tenderness.  Anus and perineum: Normal   Patient informed chaperone available to be present for breast and pelvic exam. Patient has requested no chaperone to be present. Patient has been advised what will be completed during breast and pelvic exam.   Assessment/Plan:   1. Well woman exam with routine gynecological exam Pap due 2026  2. Hormone replacement therapy (HRT) Would like to increase estrogen to help with energy, new rx sent  - progesterone (PROMETRIUM) 200 MG capsule; Take 1 capsule (200 mg total) by mouth daily. See admin instructions.  Dispense: 90 capsule; Refill: 4 - estradiol (VIVELLE-DOT) 0.1 MG/24HR patch; Place 1 patch (0.1 mg total) onto the skin 2 (two) times a week.  Dispense: 24 patch; Refill: 4  3. Acquired hypothyroidism  - thyroid (NP THYROID) 15 MG tablet; Take 1 tablet (15 mg total)  by mouth daily.  Dispense: 90 tablet; Refill: 4  4. Anxiety  - escitalopram (LEXAPRO) 5 MG tablet; Take 1 tablet (5 mg total) by mouth daily.  Dispense: 30 tablet; Refill: 0     Discussed SBE, colonoscopy and DEXA screening as directed/appropriate. Recommend of exercise weekly, including weight bearing exercise. Encouraged the use of seatbelts and sunscreen. Return in 1 year for annual or as needed.   Arlie Solomons B  WHNP-BC 3:25 PM 09/16/2022

## 2022-10-07 ENCOUNTER — Other Ambulatory Visit: Payer: Self-pay

## 2022-10-07 DIAGNOSIS — F419 Anxiety disorder, unspecified: Secondary | ICD-10-CM

## 2022-10-07 MED ORDER — ESCITALOPRAM OXALATE 5 MG PO TABS: 5.0000 mg | ORAL_TABLET | Freq: Every day | ORAL | 0 refills | Status: DC

## 2022-10-07 NOTE — Telephone Encounter (Signed)
From: Orson Aloe Keilman To: Office of [place], [place], NP Sent: 10/06/2022 7:12 PM EST Subject: Medication Renewal Request  Refills have been requested for the following medications:   escitalopram (LEXAPRO) 5 MG tablet Rubbie Battiest B]  Preferred pharmacy: [facility], [address] Delivery method: [employer]

## 2022-10-07 NOTE — Telephone Encounter (Signed)
JC pt. Last AEX 09/16/2022

## 2022-10-14 DIAGNOSIS — F4323 Adjustment disorder with mixed anxiety and depressed mood: Secondary | ICD-10-CM | POA: Diagnosis not present

## 2022-10-27 DIAGNOSIS — F4323 Adjustment disorder with mixed anxiety and depressed mood: Secondary | ICD-10-CM | POA: Diagnosis not present

## 2022-10-30 ENCOUNTER — Ambulatory Visit
Admission: RE | Admit: 2022-10-30 | Discharge: 2022-10-30 | Disposition: A | Discharge status: Home / Self Care | Payer: BC Managed Care – PPO | Source: Ambulatory Visit | Attending: Family Medicine | Admitting: Family Medicine

## 2022-10-30 DIAGNOSIS — Z1231 Encounter for screening mammogram for malignant neoplasm of breast: Secondary | ICD-10-CM | POA: Diagnosis not present

## 2022-11-13 ENCOUNTER — Other Ambulatory Visit: Payer: Self-pay | Admitting: Nurse Practitioner

## 2022-11-13 DIAGNOSIS — F419 Anxiety disorder, unspecified: Secondary | ICD-10-CM

## 2022-11-13 NOTE — Telephone Encounter (Signed)
JC pt.  Last AEX 09/16/2022--recall placed for 2024.

## 2022-11-14 ENCOUNTER — Other Ambulatory Visit: Payer: Self-pay | Admitting: Nurse Practitioner

## 2022-11-14 DIAGNOSIS — F419 Anxiety disorder, unspecified: Secondary | ICD-10-CM

## 2022-11-16 MED ORDER — ESCITALOPRAM OXALATE 5 MG PO TABS: 5.0000 mg | ORAL_TABLET | Freq: Every day | ORAL | 0 refills | Status: DC

## 2022-11-24 DIAGNOSIS — F4323 Adjustment disorder with mixed anxiety and depressed mood: Secondary | ICD-10-CM | POA: Diagnosis not present

## 2022-12-10 DIAGNOSIS — Z0000 Encounter for general adult medical examination without abnormal findings: Secondary | ICD-10-CM | POA: Diagnosis not present

## 2022-12-10 DIAGNOSIS — Z13220 Encounter for screening for lipoid disorders: Secondary | ICD-10-CM | POA: Diagnosis not present

## 2022-12-10 DIAGNOSIS — N951 Menopausal and female climacteric states: Secondary | ICD-10-CM | POA: Diagnosis not present

## 2022-12-10 DIAGNOSIS — E039 Hypothyroidism, unspecified: Secondary | ICD-10-CM | POA: Diagnosis not present

## 2022-12-15 DIAGNOSIS — F4323 Adjustment disorder with mixed anxiety and depressed mood: Secondary | ICD-10-CM | POA: Diagnosis not present

## 2023-01-19 DIAGNOSIS — F4323 Adjustment disorder with mixed anxiety and depressed mood: Secondary | ICD-10-CM | POA: Diagnosis not present

## 2023-02-02 DIAGNOSIS — F4323 Adjustment disorder with mixed anxiety and depressed mood: Secondary | ICD-10-CM | POA: Diagnosis not present

## 2023-02-16 DIAGNOSIS — F4323 Adjustment disorder with mixed anxiety and depressed mood: Secondary | ICD-10-CM | POA: Diagnosis not present

## 2023-02-16 IMAGING — CT CT ABD-PELV W/ CM
2 of 5 series · 16 of 46 positions shown, 18 images · IV contrast (omnipaque)
Comparison: None.

CLINICAL DATA: Right lower quadrant abdominal pain

EXAM:
CT ABDOMEN AND PELVIS WITH CONTRAST
TECHNIQUE: Multidetector CT imaging of the abdomen and pelvis was performed
using the standard protocol following bolus administration of
intravenous contrast.
CONTRAST:  80mL OMNIPAQUE IOHEXOL 350 MG/ML SOLN

[Series 2: axial st · axial · 0.72mm/px · z∈[-588,-208]mm · 13 of 88 slices shown, 15 images]
[im 6/88  soft-tissue]
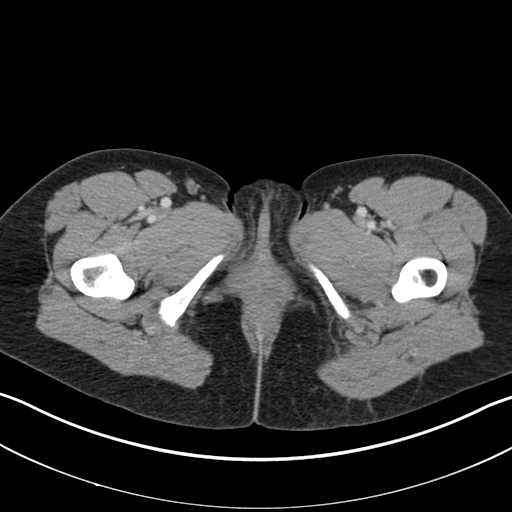
[im 6/88  bone]
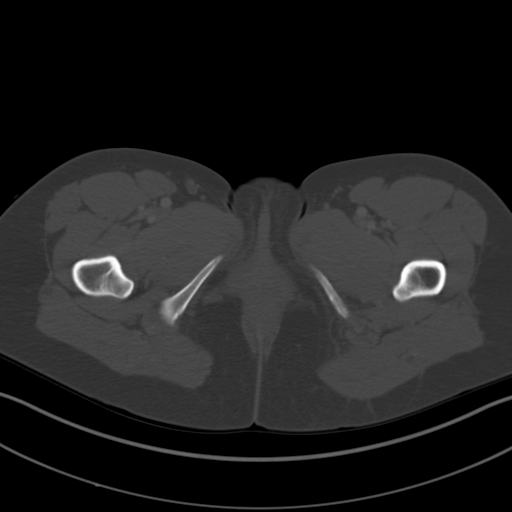
[im 12/88  soft-tissue]
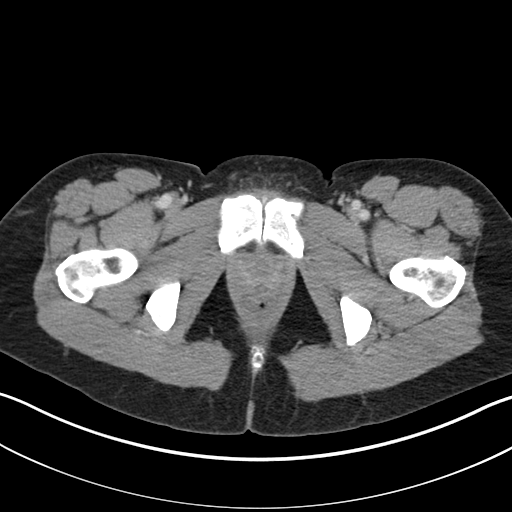
[im 18/88  soft-tissue]
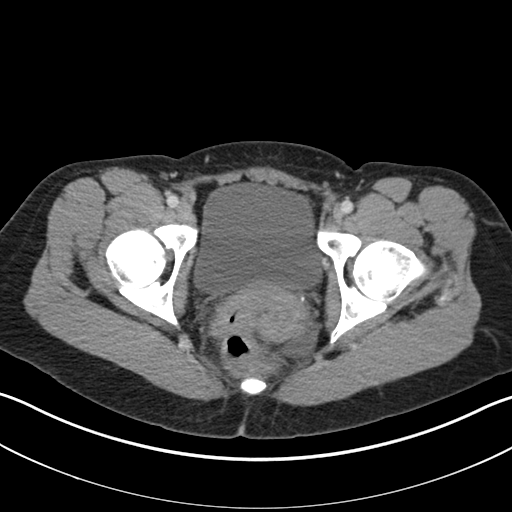
[im 24/88  soft-tissue]
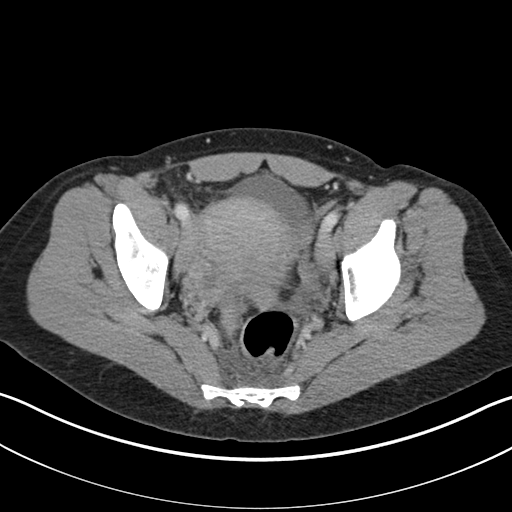
[im 30/88  soft-tissue]
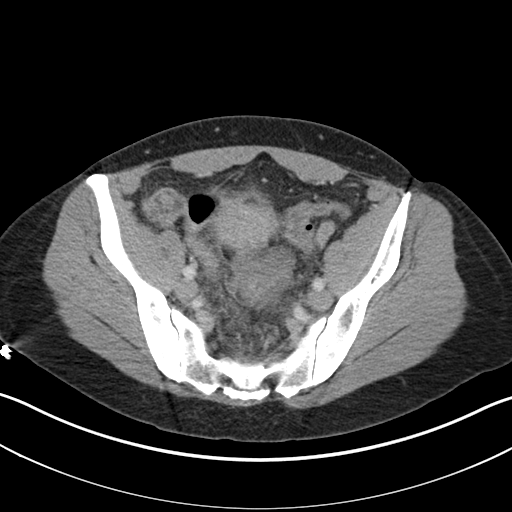
[im 35/88  soft-tissue]
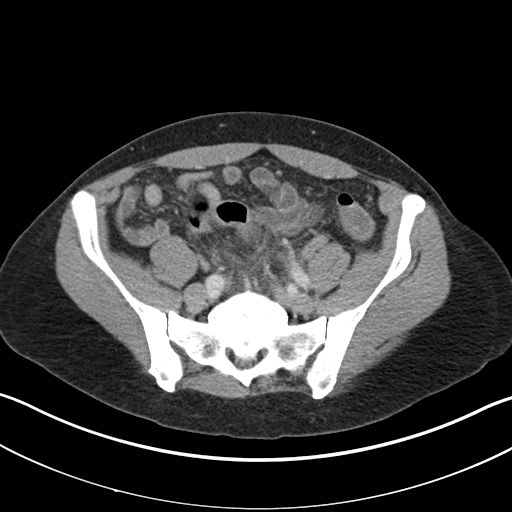
[im 47/88  soft-tissue]
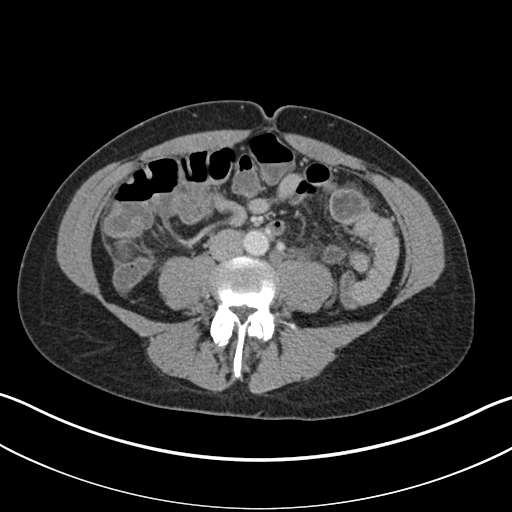
[im 53/88  soft-tissue]
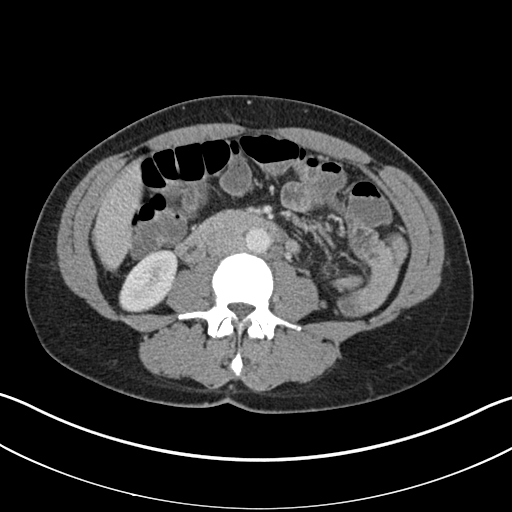
[im 59/88  soft-tissue]
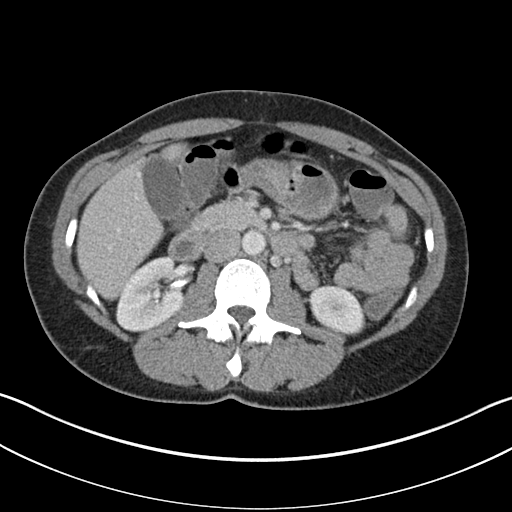
[im 59/88  bone]
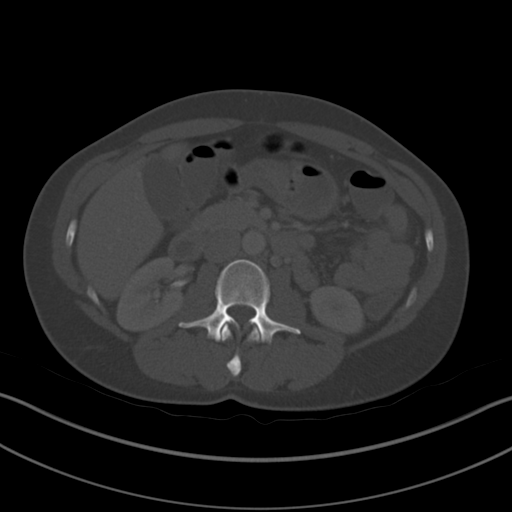
[im 64/88  soft-tissue]
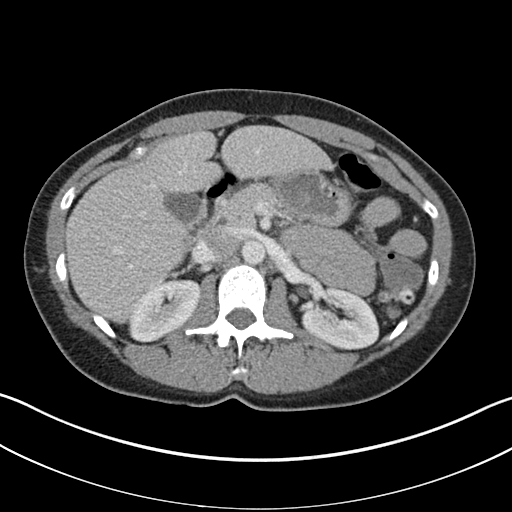
[im 70/88  soft-tissue]
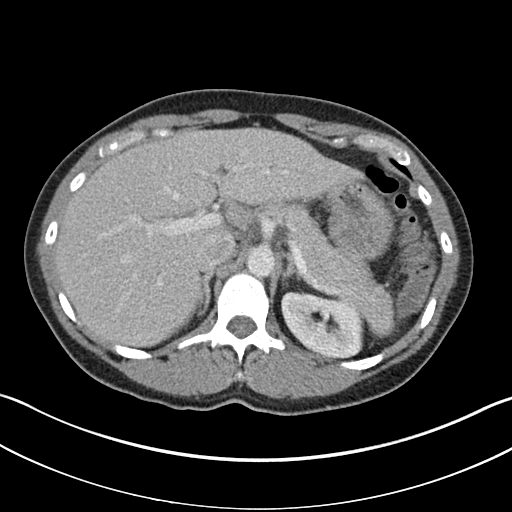
[im 76/88  soft-tissue]
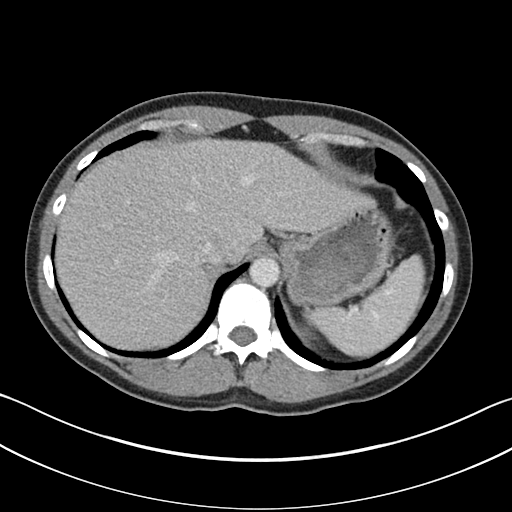
[im 82/88  soft-tissue]
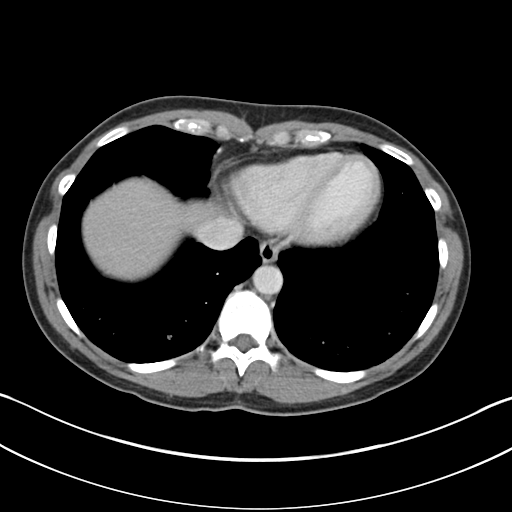

[Series 5: coronal st · coronal · 0.78mm/px · 3 of 142 slices shown]
[im 48/142  soft-tissue]
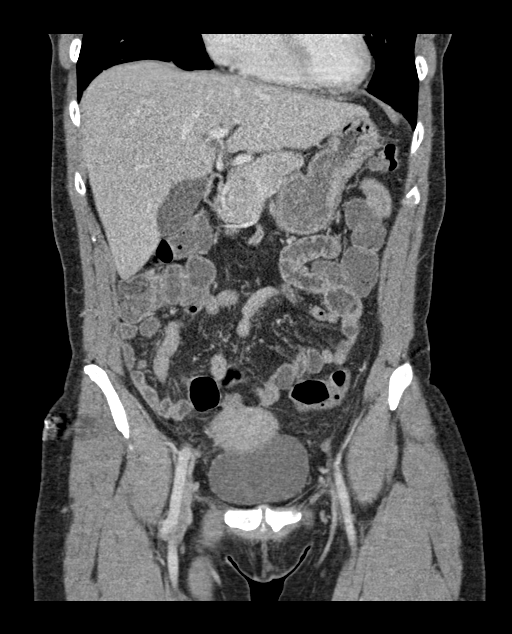
[im 63/142  soft-tissue]
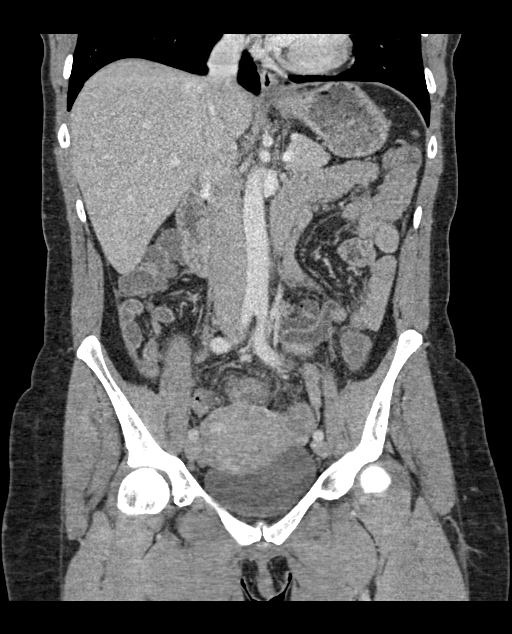
[im 79/142  soft-tissue]
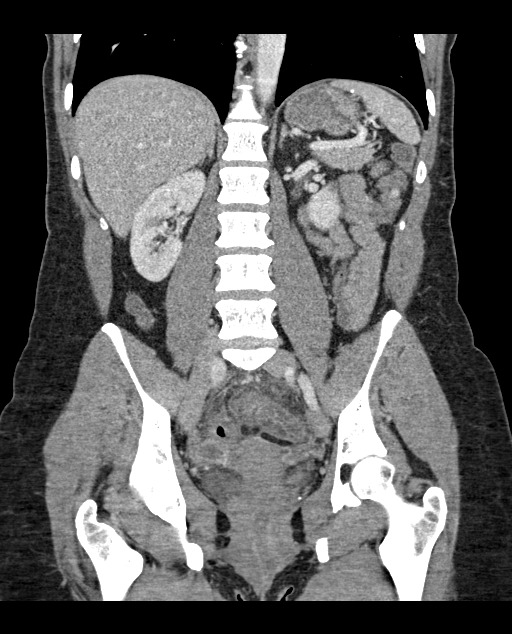

[16 of 46 positions shown; findings below may reference images not displayed]

FINDINGS: Lower chest: Lung bases are clear.

Hepatobiliary: Liver is within normal limits.

Gallbladder is unremarkable. No intrahepatic or extrahepatic ductal
dilatation.

Pancreas: Within normal limits.

Spleen: Within normal limits.

Adrenals/Urinary Tract: Adrenal glands are within normal limits.

4 mm cyst in the posterior right upper kidney (series 2/image 27).
Left kidney is within normal limits. No hydronephrosis.

Bladder is within normal limits.

Stomach/Bowel: Stomach is within normal limits.

No evidence of bowel obstruction.

Appendix is not discretely visualized. No inflammatory changes in
the right lower quadrant to suggest acute appendicitis.

Short segment wall thickening/inflammatory changes involving the
sigmoid colon (series 2/image 58), with adjacent diverticuli,
suggesting diverticulitis or less likely colitis. Given the degree
of inflammatory change, this appearance is not considered suspicious
for neoplasm.

No pneumatosis. No drainable fluid collection/abscess. No free air
to suggest macroscopic perforation.

Vascular/Lymphatic: No evidence of abdominal aortic aneurysm.

No suspicious abdominopelvic lymphadenopathy.

Reproductive: Uterus is within normal limits.

No adnexal masses.

Other: Trace pelvic fluid with mesenteric stranding along the
sigmoid mesentery.

Musculoskeletal: Mild degenerative changes at L5-S1.
IMPRESSION: Sigmoid diverticulitis versus colitis. No drainable fluid
collection/abscess. No free air to suggest macroscopic perforation.

## 2023-02-19 DIAGNOSIS — D225 Melanocytic nevi of trunk: Secondary | ICD-10-CM | POA: Diagnosis not present

## 2023-02-19 DIAGNOSIS — D2271 Melanocytic nevi of right lower limb, including hip: Secondary | ICD-10-CM | POA: Diagnosis not present

## 2023-02-19 DIAGNOSIS — D2262 Melanocytic nevi of left upper limb, including shoulder: Secondary | ICD-10-CM | POA: Diagnosis not present

## 2023-02-19 DIAGNOSIS — D2272 Melanocytic nevi of left lower limb, including hip: Secondary | ICD-10-CM | POA: Diagnosis not present

## 2023-03-09 DIAGNOSIS — F4323 Adjustment disorder with mixed anxiety and depressed mood: Secondary | ICD-10-CM | POA: Diagnosis not present

## 2023-03-30 DIAGNOSIS — F4323 Adjustment disorder with mixed anxiety and depressed mood: Secondary | ICD-10-CM | POA: Diagnosis not present

## 2023-04-13 DIAGNOSIS — R059 Cough, unspecified: Secondary | ICD-10-CM | POA: Diagnosis not present

## 2023-04-13 DIAGNOSIS — K219 Gastro-esophageal reflux disease without esophagitis: Secondary | ICD-10-CM | POA: Diagnosis not present

## 2023-04-15 DIAGNOSIS — K2930 Chronic superficial gastritis without bleeding: Secondary | ICD-10-CM | POA: Diagnosis not present

## 2023-04-15 DIAGNOSIS — R053 Chronic cough: Secondary | ICD-10-CM | POA: Diagnosis not present

## 2023-04-15 DIAGNOSIS — K219 Gastro-esophageal reflux disease without esophagitis: Secondary | ICD-10-CM | POA: Diagnosis not present

## 2023-05-04 DIAGNOSIS — F4323 Adjustment disorder with mixed anxiety and depressed mood: Secondary | ICD-10-CM | POA: Diagnosis not present

## 2023-05-06 DIAGNOSIS — H0014 Chalazion left upper eyelid: Secondary | ICD-10-CM | POA: Diagnosis not present

## 2023-05-21 ENCOUNTER — Ambulatory Visit: Payer: BC Managed Care – PPO | Admitting: Radiology

## 2023-05-21 VITALS — BP 102/64 | Temp 98.2°F

## 2023-05-21 DIAGNOSIS — R102 Pelvic and perineal pain: Secondary | ICD-10-CM

## 2023-05-21 DIAGNOSIS — E079 Disorder of thyroid, unspecified: Secondary | ICD-10-CM

## 2023-05-21 DIAGNOSIS — R748 Abnormal levels of other serum enzymes: Secondary | ICD-10-CM

## 2023-05-21 DIAGNOSIS — R6882 Decreased libido: Secondary | ICD-10-CM

## 2023-05-21 DIAGNOSIS — R5382 Chronic fatigue, unspecified: Secondary | ICD-10-CM | POA: Diagnosis not present

## 2023-05-21 LAB — URINALYSIS, COMPLETE W/RFL CULTURE
Bacteria, UA: NONE SEEN /HPF
Bilirubin Urine: NEGATIVE
Glucose, UA: NEGATIVE
Hgb urine dipstick: NEGATIVE
Hyaline Cast: NONE SEEN /LPF
Ketones, ur: NEGATIVE
Leukocyte Esterase: NEGATIVE
Nitrites, Initial: NEGATIVE
Protein, ur: NEGATIVE
RBC / HPF: NONE SEEN /HPF (ref 0–2)
Specific Gravity, Urine: 1.015 (ref 1.001–1.035)
WBC, UA: NONE SEEN /HPF (ref 0–5)
pH: 8.5 — ABNORMAL HIGH (ref 5.0–8.0)

## 2023-05-21 LAB — NO CULTURE INDICATED

## 2023-05-21 NOTE — Progress Notes (Signed)
   Melana Robichaux Strubel 05/29/1971 [id number]   History: Postmenopausal 52 y.o. presents with complaint of pelvic pain/ bloating, decreased libido, trouble sleeping. Symptoms intermittent x's 3-4 months. Would like kidney function rechecked, hx of elevated creatinine.    Gynecologic History Postmenopausal Last Pap: 2020. Results were: normal Last mammogram: 1/24. Results were: normal   Obstetric History OB History  Gravida Para Term Preterm AB Living  2 2       3   SAB IAB Ectopic Multiple Live Births        1 3    # Outcome Date GA Lbr Len/2nd Weight Sex Type Anes PTL Lv  2 Para           1A Para           1B Para              The following portions of the patient's history were reviewed and updated as appropriate: allergies, current medications, past family history, past medical history, past social history, past surgical history, and problem list.  Review of Systems Pertinent items noted in HPI and remainder of comprehensive ROS otherwise negative.  Past medical history, past surgical history, family history and social history were all reviewed and documented in the EPIC chart.  Exam:  Vitals:   05/21/23 0806  BP: 102/64  Temp: 98.2 F (36.8 C)  TempSrc: Oral   There is no height or weight on file to calculate BMI.  General appearance:  Normal Abdominal  Soft,nontender, without masses, guarding or rebound.  Liver/spleen:  No organomegaly noted  Hernia:  None appreciated  Skin  Inspection:  Grossly normal Genitourinary   Inguinal/mons:  Normal without inguinal adenopathy  External genitalia:  Normal appearing vulva with no masses, tenderness, or lesions  BUS/Urethra/Skene's glands:  Normal  Vagina:  Normal appearing with normal color and discharge, no lesions.   Cervix:  Normal appearing without discharge or lesions  Uterus:  Normal in size, shape and contour.  Midline and mobile, nontender  Adnexa/parametria:     Rt: Normal in size, without masses or  tenderness.   Lt: Normal in size, without masses or tenderness.  Anus and perineum: Normal    [name], CMA present for exam  Assessment/Plan:   1. Pelvic pain/bloating - Urinalysis,Complete w/RFL Culture; normal - US PELVIC COMPLETE WITH TRANSVAGINAL; Future  2. Low libido - Testos,Total,Free and SHBG (Female)  3. Elevated creatine kinase history - Comp Met (CMET)  4. Chronic fatigue - Thyroid Panel With TSH  5. Thyroid dysfunction - Thyroid Panel With TSH     ,  B WHNP-BC, 9:15 AM 05/21/2023

## 2023-05-25 DIAGNOSIS — F4323 Adjustment disorder with mixed anxiety and depressed mood: Secondary | ICD-10-CM | POA: Diagnosis not present

## 2023-05-26 LAB — THYROID PANEL WITH TSH
Free Thyroxine Index: 2.3 (ref 1.4–3.8)
T3 Uptake: 30 % (ref 22–35)
T4, Total: 7.7 ug/dL (ref 5.1–11.9)
TSH: 2.84 m[IU]/L

## 2023-05-26 LAB — COMPREHENSIVE METABOLIC PANEL
AG Ratio: 2 (calc) (ref 1.0–2.5)
ALT: 12 U/L (ref 6–29)
AST: 17 U/L (ref 10–35)
Albumin: 4.8 g/dL (ref 3.6–5.1)
Alkaline phosphatase (APISO): 50 U/L (ref 37–153)
BUN/Creatinine Ratio: 13 (calc) (ref 6–22)
BUN: 14 mg/dL (ref 7–25)
CO2: 26 mmol/L (ref 20–32)
Calcium: 9.5 mg/dL (ref 8.6–10.4)
Chloride: 105 mmol/L (ref 98–110)
Creat: 1.07 mg/dL — ABNORMAL HIGH (ref 0.50–1.03)
Globulin: 2.4 g/dL (ref 1.9–3.7)
Glucose, Bld: 86 mg/dL (ref 65–99)
Potassium: 4.4 mmol/L (ref 3.5–5.3)
Sodium: 138 mmol/L (ref 135–146)
Total Bilirubin: 0.6 mg/dL (ref 0.2–1.2)
Total Protein: 7.2 g/dL (ref 6.1–8.1)

## 2023-05-26 LAB — TESTOS,TOTAL,FREE AND SHBG (FEMALE)
Free Testosterone: 1.2 pg/mL (ref 0.1–6.4)
Sex Hormone Binding: 100.9 nmol/L (ref 17–124)
Testosterone, Total, LC-MS-MS: 20 ng/dL (ref 2–45)

## 2023-05-27 NOTE — Progress Notes (Signed)
2 refills, please have her scheduler her AEX (due in early December) and I will follow up then

## 2023-05-28 ENCOUNTER — Other Ambulatory Visit: Payer: Self-pay

## 2023-05-28 DIAGNOSIS — R6882 Decreased libido: Secondary | ICD-10-CM

## 2023-05-28 MED ORDER — NONFORMULARY OR COMPOUNDED ITEM: 2 refills | Status: AC

## 2023-06-09 DIAGNOSIS — F4323 Adjustment disorder with mixed anxiety and depressed mood: Secondary | ICD-10-CM | POA: Diagnosis not present

## 2023-06-22 DIAGNOSIS — F4323 Adjustment disorder with mixed anxiety and depressed mood: Secondary | ICD-10-CM | POA: Diagnosis not present

## 2023-06-23 DIAGNOSIS — J069 Acute upper respiratory infection, unspecified: Secondary | ICD-10-CM | POA: Diagnosis not present

## 2023-06-23 DIAGNOSIS — R944 Abnormal results of kidney function studies: Secondary | ICD-10-CM | POA: Diagnosis not present

## 2023-06-23 DIAGNOSIS — E039 Hypothyroidism, unspecified: Secondary | ICD-10-CM | POA: Diagnosis not present

## 2023-06-23 DIAGNOSIS — F411 Generalized anxiety disorder: Secondary | ICD-10-CM | POA: Diagnosis not present

## 2023-06-29 DIAGNOSIS — F4323 Adjustment disorder with mixed anxiety and depressed mood: Secondary | ICD-10-CM | POA: Diagnosis not present

## 2023-07-01 DIAGNOSIS — Z23 Encounter for immunization: Secondary | ICD-10-CM | POA: Diagnosis not present

## 2023-07-20 DIAGNOSIS — F4323 Adjustment disorder with mixed anxiety and depressed mood: Secondary | ICD-10-CM | POA: Diagnosis not present

## 2023-07-27 DIAGNOSIS — D23122 Other benign neoplasm of skin of left lower eyelid, including canthus: Secondary | ICD-10-CM | POA: Diagnosis not present

## 2023-07-27 DIAGNOSIS — H5213 Myopia, bilateral: Secondary | ICD-10-CM | POA: Diagnosis not present

## 2023-08-03 DIAGNOSIS — F4323 Adjustment disorder with mixed anxiety and depressed mood: Secondary | ICD-10-CM | POA: Diagnosis not present

## 2023-08-11 DIAGNOSIS — J309 Allergic rhinitis, unspecified: Secondary | ICD-10-CM | POA: Diagnosis not present

## 2023-08-11 DIAGNOSIS — R053 Chronic cough: Secondary | ICD-10-CM | POA: Diagnosis not present

## 2023-08-11 DIAGNOSIS — R490 Dysphonia: Secondary | ICD-10-CM | POA: Diagnosis not present

## 2023-08-17 DIAGNOSIS — F4323 Adjustment disorder with mixed anxiety and depressed mood: Secondary | ICD-10-CM | POA: Diagnosis not present

## 2023-08-24 ENCOUNTER — Other Ambulatory Visit: Payer: Self-pay

## 2023-08-24 ENCOUNTER — Ambulatory Visit: Payer: BC Managed Care – PPO | Attending: Otolaryngology

## 2023-08-24 DIAGNOSIS — R490 Dysphonia: Secondary | ICD-10-CM | POA: Diagnosis not present

## 2023-08-24 NOTE — Patient Instructions (Signed)
   Practice abdominal breathing 10 minutes twice each day  Practice the mouth and nose buzzing feeling with the straw in and out of the water, and if you like with "ooo"

## 2023-08-24 NOTE — Therapy (Signed)
OUTPATIENT SPEECH LANGUAGE PATHOLOGY VOICE EVALUATION   Patient Name: [name] MRN: [id number] DOB:[birth year], 52 y.o., female Today's Date: 08/24/2023  PCP: Merri Brunette, MD REFERRING PROVIDER: Cheron Schaumann, MD  END OF SESSION:   Past Medical History:  Diagnosis Date   Diverticulitis 02/2021   Hypothyroidism    Kidney disease    Past Surgical History:  Procedure Laterality Date   back procedure  2008   Patient Active Problem List   Diagnosis Date Noted   Amenorrhea 12/24/2021   Chronic renal failure syndrome 12/24/2021   Diverticular disease of colon 12/24/2021   Diverticulitis 12/24/2021   Menopause 12/24/2021   Osteoarthritis of right knee 09/21/2018    Onset date: 2 years ago; script   REFERRING DIAG: Muscle Tension Dysphonia Chronic Cough  THERAPY DIAG:  Hoarseness  Rationale for Evaluation and Treatment: Rehabilitation  SUBJECTIVE:   SUBJECTIVE STATEMENT: "I've really been working on not clearing my throat."  Pt accompanied by: self  PERTINENT HISTORY:  [name] is a 52 y.o. female who presents as a new patient, referred by No ref. provider found, for evaluation and treatment of persistent cough. She has been experiencing this cough for nearly 2 years, initially attributing it to a common cold. Despite trying various cold remedies, the cough persisted. She was then diagnosed with Gastroesophageal Reflux Disease (GERD) and underwent an endoscopy, which yielded normal results. Despite taking reflux medication, she reports minimal relief. She takes two Prilosec tablets in the morning and two at night, but these have not alleviated her symptoms. Her cough is dry, and she often needs to clear her throat. She is currently seeking to identify the cause of her nighttime cough. She has not yet undergone allergy testing. She reports no history of smoking, tobacco use, or vaping, and does not have asthma. She reports no shortness of breath  or difficulty breathing, and there are no concerns for obstructive sleep apnea.    PAIN:  Are you having pain? No  FALLS: Has patient fallen in last 6 months? No,   LIVING ENVIRONMENT: Lives with: lives with their spouse Lives in: House/apartment  PLOF:Level of assistance: Independent with ADLs, Independent with IADLs Employment: Full-time employment; Education officer, environmental  PATIENT GOALS: Improve voice   OBJECTIVE:  Note: Objective measures were completed at Evaluation unless otherwise noted.  DIAGNOSTIC FINDINGS:  After adequate topical anesthetic was applied, 4 mm flexible laryngoscope was passed through the nasal cavity without difficulty. Flexible laryngoscopy shows patent anterior nasal cavity with minimal crusting, no discharge or infection.  Normal base of tongue and supraglottis Normal vocal cord mobility without vocal cord nodule, mass, polyp or tumor. Deeper glottic compression noted with sustained phonation, consistent with muscle tension dysphonia. Hypopharynx normal without mass, pooling of secretions or aspiration.   COGNITION: Overall cognitive status: Within functional limits for tasks assessed  SOCIAL HISTORY: Occupation: Education officer, environmental at [employer] intake: suboptimal 50 oz/day Caffeine/alcohol intake:  16 oz/tea 5/7 days/week Daily voice use: moderate-significant voice use  PERCEPTUAL VOICE ASSESSMENT: Voice quality: harsh and rough Vocal abuse: abnormal breathing pattern and excessive voice use Resonance: normal Respiratory function: thoracic breathing and clavicular breathing  OBJECTIVE VOICE ASSESSMENT: Maximum phonation time for sustained "ah": 17.2 Conversational pitch average: 196 Hz Conversational pitch range: 140-314 Hz Conversational loudness average: 53-82 dB Conversational loudness range: 69 dB  PATIENT REPORTED OUTCOME MEASURES (PROM): V-RQOL:   and VHI: provided in first 2 sessions  TODAY'S TREATMENT:      AB= AB=Abdominal Breathing  DATE:  08/24/23 (eval): SLP educated pt about AB and rationale for using this when talking. SLP worked with pt to obtain AB while breathing at rest. Pt succeeded at rest in seated position 90% of the time. Lastly, SLP taught pt about straw phonation procedure and rationale. Pt with WNL voicing quality with straw phonation 80-85%, and with "ooo" ~70% of attempts (4/6).  PATIENT EDUCATION: Education details: evaluation details/results, AB procedure, straw phonation procedure and rationale, how SLP can assist pt with obtaining AB habitually, and change pt's muscle tension dysphonia habit Person educated: Patient Education method: Explanation, Demonstration, Verbal cues, and Handouts Education comprehension: verbalized understanding, returned demonstration, verbal cues required, and needs further education  HOME EXERCISE PROGRAM: AB 10 minutes BID, straw phonation (See "pt instructions")  GOALS: Goals reviewed with patient? Yes  SHORT TERM GOALS: Target date: 09/22/23  Pt will perform at least 3 voice exercises in session, to improve voice quality with 85% success, for 3 sessions Baseline: Goal status: INITIAL  2.  In sentence responses, pt will produce WNL voicing 85% of the time over three sessions Baseline:  Goal status: INITIAL  3.  Pt will maintain AB in sentence responses 85% of the time, in 3 sessions Baseline:  Goal status: INITIAL    LONG TERM GOALS: Target date: 10/30/23  Pt will improve PROM scores compared to initial administration Baseline:  Goal status: INITIAL  2.  In 15 minutes mod complex conversation, pt will produce WNL voicing 85% of the time over three sessions Baseline:  Goal status: INITIAL  3.  Pt will maintain AB 85% of the time, in 15 minutes mod complex conversation  in 3 sessions Baseline:  Goal status: INITIAL  4. Pt will indicate that  her voice does not feel tired until after 5pm, over 3 consecutive  sessions Baseline:  Goal status: INITIAL  ASSESSMENT:  CLINICAL IMPRESSION: Patient is a 52 y.o. F who was seen today for assessment of voice quality and voice use. She has been working very diligently in curbing throat clearing and today did not do so at all. She indicates to SLP that her voice feels tired by 2-3 pm daily, and on Sunday by noon.   OBJECTIVE IMPAIRMENTS: include voice disorder. These impairments are limiting patient from ADLs/IADLs, effectively communicating at home and in community, and while at work . Factors affecting potential to achieve goals and functional outcome are  her occupational needs using her voice . Patient will benefit from skilled SLP services to address above impairments and improve overall function.  REHAB POTENTIAL: Good  PLAN:  SLP FREQUENCY: 2x/week  SLP DURATION: 8 weeks  PLANNED INTERVENTIONS: 92507 Treatment of speech (30 or 45 min) , Internal/external aids, Functional tasks, SLP instruction and feedback, Compensatory strategies, Patient/family education, and voice exercises.    Shaylyn Bawa, CCC-SLP 08/24/2023, 9:55 PM

## 2023-08-31 ENCOUNTER — Ambulatory Visit: Payer: BC Managed Care – PPO

## 2023-08-31 DIAGNOSIS — F4323 Adjustment disorder with mixed anxiety and depressed mood: Secondary | ICD-10-CM | POA: Diagnosis not present

## 2023-08-31 DIAGNOSIS — R490 Dysphonia: Secondary | ICD-10-CM | POA: Diagnosis not present

## 2023-08-31 NOTE — Therapy (Signed)
OUTPATIENT SPEECH LANGUAGE PATHOLOGY VOICE TREATMENT   Patient Name: [name] MRN: [id number] DOB:01-13-1971, 52 y.o., female Today's Date: 08/31/2023  PCP: Merri Brunette, MD REFERRING PROVIDER: Cheron Schaumann, MD  END OF SESSION:  End of Session - 08/31/23 1230     Visit Number 2    Number of Visits 17    Date for SLP Re-Evaluation 10/30/23    SLP Start Time 0804    SLP Stop Time  0845    SLP Time Calculation (min) 41 min    Activity Tolerance Patient tolerated treatment well             Past Medical History:  Diagnosis Date   Diverticulitis 02/2021   Hypothyroidism    Kidney disease    Past Surgical History:  Procedure Laterality Date   back procedure  2008   Patient Active Problem List   Diagnosis Date Noted   Amenorrhea 12/24/2021   Chronic renal failure syndrome 12/24/2021   Diverticular disease of colon 12/24/2021   Diverticulitis 12/24/2021   Menopause 12/24/2021   Osteoarthritis of right knee 09/21/2018    Onset date: 2 years ago; script   REFERRING DIAG: Muscle Tension Dysphonia Chronic Cough  THERAPY DIAG:  Hoarseness  Rationale for Evaluation and Treatment: Rehabilitation  SUBJECTIVE:   SUBJECTIVE STATEMENT: "It was interesting, after I was here last I had more trouble sleeping and was clearing my throat that night."  Pt accompanied by: self  PERTINENT HISTORY:  [name] is a 52 y.o. female who presents as a new patient, referred by No ref. provider found, for evaluation and treatment of persistent cough. She has been experiencing this cough for nearly 2 years, initially attributing it to a common cold. Despite trying various cold remedies, the cough persisted. She was then diagnosed with Gastroesophageal Reflux Disease (GERD) and underwent an endoscopy, which yielded normal results. Despite taking reflux medication, she reports minimal relief. She takes two Prilosec tablets in the morning and two at night, but  these have not alleviated her symptoms. Her cough is dry, and she often needs to clear her throat. She is currently seeking to identify the cause of her nighttime cough. She has not yet undergone allergy testing. She reports no history of smoking, tobacco use, or vaping, and does not have asthma. She reports no shortness of breath or difficulty breathing, and there are no concerns for obstructive sleep apnea.    PAIN:  Are you having pain? No  FALLS: Has patient fallen in last 6 months? No,   PATIENT GOALS: Improve voice   OBJECTIVE:  Note: Objective measures were completed at Evaluation unless otherwise noted.  DIAGNOSTIC FINDINGS:  After adequate topical anesthetic was applied, 4 mm flexible laryngoscope was passed through the nasal cavity without difficulty. Flexible laryngoscopy shows patent anterior nasal cavity with minimal crusting, no discharge or infection.  Normal base of tongue and supraglottis Normal vocal cord mobility without vocal cord nodule, mass, polyp or tumor. Deeper glottic compression noted with sustained phonation, consistent with muscle tension dysphonia. Hypopharynx normal without mass, pooling of secretions or aspiration.    PATIENT REPORTED OUTCOME MEASURES (PROM): V-RQOL:   and VHI: provided in first 2 sessions  TODAY'S TREATMENT:      AB= AB=Abdominal Breathing  DATE:  08/31/23: Pt needs V-RQOL and VHI next session. Pt is improved on her water intake since last session. AB at rest 95%+ for 3 minutes. SLP had her practice resonant voicing with /u/ and /m/ phonemes, and then words with /u/ and /m/ using AB. Pt 85% successful with WNL voicing. Then progressed to /u/ words with fricatives and sentences with /u/ and fricatives with pt demonstrating 80% success with WNL voicing. SLP then had pt repeat words from monosyllabic word list to  focus on making WNL voicing with word level production - pt 80% successful. SLP provided pt with monosyllabic words to practice.   08/24/23 (eval): SLP educated pt about AB and rationale for using this when talking. SLP worked with pt to obtain AB while breathing at rest. Pt succeeded at rest in seated position 90% of the time. Lastly, SLP taught pt about straw phonation procedure and rationale. Pt with WNL voicing quality with straw phonation 80-85%, and with "ooo" ~70% of attempts (4/6).  PATIENT EDUCATION: Education details: see "today's treatment" Person educated: Patient Education method: Explanation, Demonstration, Verbal cues, and Handouts Education comprehension: verbalized understanding, returned demonstration, verbal cues required, and needs further education  HOME EXERCISE PROGRAM: AB and resonant voice homework  GOALS: Goals reviewed with patient? Yes  SHORT TERM GOALS: Target date: 09/22/23  Pt will perform at least 3 voice exercises in session, to improve voice quality with 85% success, for 3 sessions Baseline: Goal status: INITIAL  2.  In sentence responses, pt will produce WNL voicing 85% of the time over three sessions Baseline:  Goal status: INITIAL  3.  Pt will maintain AB in sentence responses 85% of the time, in 3 sessions Baseline:  Goal status: INITIAL    LONG TERM GOALS: Target date: 10/30/23  Pt will improve PROM scores compared to initial administration Baseline:  Goal status: INITIAL  2.  In 15 minutes mod complex conversation, pt will produce WNL voicing 85% of the time over three sessions Baseline:  Goal status: INITIAL  3.  Pt will maintain AB 85% of the time, in 15 minutes mod complex conversation  in 3 sessions Baseline:  Goal status: INITIAL  4. Pt will indicate that her voice does not feel tired until after 5pm, over 3 consecutive  sessions Baseline:  Goal status: INITIAL  ASSESSMENT:  CLINICAL IMPRESSION: Patient is a 52 y.o. F  who was seen today for treatment of voice quality and voice use. See "today's treatment" for more details. She has been working very diligently in curbing throat clearing and today did not do so at all. She indicates to SLP that her voice feels tired by 2-3 pm daily, and on Sunday by noon.   OBJECTIVE IMPAIRMENTS: include voice disorder. These impairments are limiting patient from ADLs/IADLs, effectively communicating at home and in community, and while at work . Factors affecting potential to achieve goals and functional outcome are  her occupational needs using her voice . Patient will benefit from skilled SLP services to address above impairments and improve overall function.  REHAB POTENTIAL: Good  PLAN:  SLP FREQUENCY: 2x/week  SLP DURATION: 8 weeks  PLANNED INTERVENTIONS: 92507 Treatment of speech (30 or 45 min) , Internal/external aids, Functional tasks, SLP instruction and feedback, Compensatory strategies, Patient/family education, and voice exercises.    Vanessa Alesi, CCC-SLP 08/31/2023, 12:32 PM

## 2023-09-02 ENCOUNTER — Other Ambulatory Visit: Payer: Self-pay

## 2023-09-02 ENCOUNTER — Ambulatory Visit: Payer: BC Managed Care – PPO | Admitting: Allergy

## 2023-09-02 ENCOUNTER — Ambulatory Visit: Payer: BC Managed Care – PPO

## 2023-09-02 ENCOUNTER — Encounter: Payer: Self-pay | Admitting: Allergy

## 2023-09-02 VITALS — BP 108/68 | HR 64 | Temp 97.8°F | Resp 16 | Ht 64.25 in | Wt 146.7 lb

## 2023-09-02 DIAGNOSIS — R490 Dysphonia: Secondary | ICD-10-CM | POA: Diagnosis not present

## 2023-09-02 DIAGNOSIS — R0982 Postnasal drip: Secondary | ICD-10-CM

## 2023-09-02 DIAGNOSIS — J310 Chronic rhinitis: Secondary | ICD-10-CM

## 2023-09-02 MED ORDER — RYALTRIS 665-25 MCG/ACT NA SUSP: 2.0000 | Freq: Two times a day (BID) | NASAL | 5 refills | Status: DC | PRN

## 2023-09-02 NOTE — Patient Instructions (Signed)
Rhinitis with post-nasal drip Chronic symptoms of postnasal drainage, particularly at night, leading to sleep disturbances. Currently managed with Singulair, Flonase, and Zyrtec with significant improvement. Evidence of allergies noted on previous examination by ENT. -Continue Singulair and Zyrtec daily. -Discontinue Zyrtec three days prior to allergy testing. -Initiate Ryaltris nasal spray 2 sprays 1-2 times daily for control of drainage and congestion.  This is a combination spray with olopatadine for drainage control and mometasone for congestion drainage.  Hold Flonase while using Ryaltris.   -Recommend nasal saline flush.  Use saline rinse kit prior to nasal spray use.  Use distilled water or boil water and bring to room temperature (do not use tap water).  Keep mouth open during entirety of rinse.      Return for allergy skin testing on 09/10/2023 at 2pm

## 2023-09-02 NOTE — Progress Notes (Signed)
New Patient Note  RE: [name] MRN: [id number] DOB: Jan 22, 1971 Date of Office Visit: 09/02/2023  Primary care provider: Merri Brunette, MD  Chief Complaint: allergies  History of present illness: [name] is a 52 y.o. female presenting today for evaluation of rhinitis.    Discussed the use of AI scribe software for clinical note transcription with the patient, who gave verbal consent to proceed.    She presents with a three-year history of sleep disturbances. Despite treatment for GERD and sleeping with an elevated head of the bed, the patient's sleep disturbances persisted. An endoscopy was performed, which revealed no abnormalities. The patient was then referred to an ENT specialist where she had nasal endoscopy performed that was consistent allergic appearance.  She was then prescribed Singulair, Flonase, and Zyrtec. This regimen has significantly improved the patient's sleep quality over the past two to three weeks since she has had these medications.   The patient also reports a history of clearing her throat and is currently seeing a voice therapist due to damage from straining her voice. The patient describes a sensation of needing to "push really hard" to scratch her throat for relief, particularly at night and this has caused straining of the vocal area.  The patient denies any changes in her environment over the past three years and initially attributed her symptoms to a cold. She has made dietary modifications to manage GERD symptoms, including avoiding chocolate at night and waiting three hours after eating before lying down.  She has no history of asthma, eczema, or inhaler use. The patient's children have a history of asthma and have undergone allergy testing in the past.    Review of systems: 10pt ROS negative unless noted above in HPI  All other systems negative unless noted above in HPI  Past medical history: Past Medical History:   Diagnosis Date   Diverticulitis 02/2021   Hypothyroidism    Kidney disease     Past surgical history: Past Surgical History:  Procedure Laterality Date   back procedure  2008    Family history:  Family History  Problem Relation Age of Onset   Tremor Mother        essential tremor   Allergic rhinitis Father    Thyroid cancer Father    Transient ischemic attack Father    Urticaria Other    Eczema Other    Asthma Other    Angioedema Other    Breast cancer Neg Hx     Social history: Lives in a home with carpeting in the bedroom with electric heating and central cooling.  Dog in the home.  There is no concern for water damage, mildew or roaches in the home.  She is a Education officer, environmental.  She has no smoke exposures or history of use.   Medication List: Current Outpatient Medications  Medication Sig Dispense Refill   cetirizine (ZYRTEC) 10 MG chewable tablet Chew 10 mg by mouth daily.     Cholecalciferol (VITAMIN D) 50 MCG (2000 UT) CAPS      escitalopram (LEXAPRO) 10 MG tablet Take 5 mg by mouth daily.     estradiol (VIVELLE-DOT) 0.1 MG/24HR patch Place 1 patch (0.1 mg total) onto the skin 2 (two) times a week. 24 patch 4   fluticasone (FLONASE) 50 MCG/ACT nasal spray Place 2 sprays into both nostrils daily. BID morning and night     Magnesium Gluconate (MAGNESIUM 27 PO) 500mg      montelukast (SINGULAIR) 10 MG tablet  Take 10 mg by mouth at bedtime. At night     NONFORMULARY OR COMPOUNDED ITEM Testosterone PLO or amyhourous base 4%  #30 gms Apply a pea size amount at bedtime to the inner thigh. 2 refills 30 each 2   Probiotic Product (PROBIOTIC PO) Take by mouth.     progesterone (PROMETRIUM) 200 MG capsule Take 1 capsule (200 mg total) by mouth daily. See admin instructions. 90 capsule 4   thyroid (NP THYROID) 15 MG tablet Take 1 tablet (15 mg total) by mouth daily. 90 tablet 4   No current facility-administered medications for this visit.    Known medication allergies: No Known  Allergies   Physical examination: Blood pressure 108/68, pulse 64, temperature 97.8 F (36.6 C), temperature source Temporal, resp. rate 16, height 5' 4.25" (1.632 m), weight 146 lb 11.2 oz (66.5 kg), last menstrual period 10/03/2019, SpO2 97%.  General: Alert, interactive, in no acute distress. HEENT: PERRLA, TMs pearly gray, turbinates minimally edematous without discharge, post-pharynx non erythematous. Neck: Supple without lymphadenopathy. Lungs: Clear to auscultation without wheezing, rhonchi or rales. {no increased work of breathing. CV: Normal S1, S2 without murmurs. Abdomen: Nondistended, nontender. Skin: Warm and dry, without lesions or rashes. Extremities:  No clubbing, cyanosis or edema. Neuro:   Grossly intact.  Diagnositics/Labs: None today  Assessment and plan: Rhinitis with post-nasal drip Chronic symptoms of postnasal drainage, particularly at night, leading to sleep disturbances. Currently managed with Singulair, Flonase, and Zyrtec with significant improvement. Evidence of allergies noted on previous examination by ENT. -Continue Singulair and Zyrtec daily. -Discontinue Zyrtec three days prior to allergy testing. -Initiate Ryaltris nasal spray 2 sprays 1-2 times daily for control of drainage and congestion.  This is a combination spray with olopatadine for drainage control and mometasone for congestion drainage.  Hold Flonase while using Ryaltris.   -Recommend nasal saline flush.  Use saline rinse kit prior to nasal spray use.  Use distilled water or boil water and bring to room temperature (do not use tap water).  Keep mouth open during entirety of rinse.    Return for allergy skin testing on 09/10/2023 at 2pm  I appreciate the opportunity to take part in Season's care. Please do not hesitate to contact me with questions.  Sincerely,   Margo Aye, MD Allergy/Immunology [facility]

## 2023-09-02 NOTE — Therapy (Signed)
OUTPATIENT SPEECH LANGUAGE PATHOLOGY VOICE TREATMENT   Patient Name: [name] MRN: [id number] DOB:Dec 29, 1970, 52 y.o., female Today's Date: 09/02/2023  PCP: Merri Brunette, MD REFERRING PROVIDER: Cheron Schaumann, MD  END OF SESSION:  End of Session - 09/02/23 1057     Visit Number 3    Number of Visits 17    Date for SLP Re-Evaluation 10/30/23    SLP Start Time 0804    SLP Stop Time  0847    SLP Time Calculation (min) 43 min    Activity Tolerance Patient tolerated treatment well              Past Medical History:  Diagnosis Date   Diverticulitis 02/2021   Hypothyroidism    Kidney disease    Past Surgical History:  Procedure Laterality Date   back procedure  2008   Patient Active Problem List   Diagnosis Date Noted   Amenorrhea 12/24/2021   Chronic renal failure syndrome 12/24/2021   Diverticular disease of colon 12/24/2021   Diverticulitis 12/24/2021   Menopause 12/24/2021   Osteoarthritis of right knee 09/21/2018    Onset date: 2 years ago; script   REFERRING DIAG: Muscle Tension Dysphonia Chronic Cough  THERAPY DIAG:  Hoarseness  Rationale for Evaluation and Treatment: Rehabilitation  SUBJECTIVE:   SUBJECTIVE STATEMENT: "I practiced, but not as much as you wanted me to."  Pt accompanied by: self  PERTINENT HISTORY:  [name] is a 52 y.o. female who presents as a new patient, referred by No ref. provider found, for evaluation and treatment of persistent cough. She has been experiencing this cough for nearly 2 years, initially attributing it to a common cold. Despite trying various cold remedies, the cough persisted. She was then diagnosed with Gastroesophageal Reflux Disease (GERD) and underwent an endoscopy, which yielded normal results. Despite taking reflux medication, she reports minimal relief. She takes two Prilosec tablets in the morning and two at night, but these have not alleviated her symptoms. Her cough is dry,  and she often needs to clear her throat. She is currently seeking to identify the cause of her nighttime cough. She has not yet undergone allergy testing. She reports no history of smoking, tobacco use, or vaping, and does not have asthma. She reports no shortness of breath or difficulty breathing, and there are no concerns for obstructive sleep apnea.    PAIN:  Are you having pain? No  FALLS: Has patient fallen in last 6 months? No,   PATIENT GOALS: Improve voice   OBJECTIVE:  Note: Objective measures were completed at Evaluation unless otherwise noted.  DIAGNOSTIC FINDINGS:  After adequate topical anesthetic was applied, 4 mm flexible laryngoscope was passed through the nasal cavity without difficulty. Flexible laryngoscopy shows patent anterior nasal cavity with minimal crusting, no discharge or infection.  Normal base of tongue and supraglottis Normal vocal cord mobility without vocal cord nodule, mass, polyp or tumor. Deeper glottic compression noted with sustained phonation, consistent with muscle tension dysphonia. Hypopharynx normal without mass, pooling of secretions or aspiration.    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  SLP began working with pt on decreasing vocal fry by having her repeat final vowel words (idea, memo, review, etc). Pt eventually was 90% with these words. "It feels like I'm doing a crunch" pt stated, due to improved abdominal recruitment. SLP then practiced "forward voice" instead of "harsh voice" - SLP did some auditory examples and after 2 examples pt began to hear difference. SLP had pt repeat 2-4  word phrases practicing forward voice/new voice. SLP also had to cue pt for vocal fry in these phrases and eventually achieved 90% success with repeating phrases. SLP educated pt about "target words" with nasals, and /u/ and /o/ vowels and told pt to use these if she notes she is using a lot of harsh voice. Pt will practice beginning with target words, then saying words or two-word combinations targeting a forward voice.   08/31/23: Pt needs V-RQOL and VHI next session. Pt is improved on her water intake since last session. AB at rest 95%+ for 3 minutes. SLP had her practice resonant voicing with /u/ and /m/ phonemes, and then words with /u/ and /m/ using AB. Pt 85% successful with WNL voicing. Then progressed to /u/ words with fricatives and sentences with /u/ and fricatives with pt demonstrating 80% success with WNL voicing. SLP then had pt repeat words from monosyllabic word list to focus on making WNL voicing with word level production - pt 80% successful. SLP provided pt with monosyllabic words to practice.   08/24/23 (eval): SLP educated pt about AB and rationale for using this when talking. SLP worked with pt to obtain AB while breathing at rest. Pt succeeded at rest in seated position 90% of the time. Lastly, SLP taught pt about straw phonation procedure and rationale. Pt with WNL voicing quality with straw phonation 80-85%, and with "ooo" ~70% of attempts (4/6).  PATIENT EDUCATION: Education details: see "today's treatment" Person educated: Patient Education method: Explanation, Demonstration, Verbal cues, and Handouts Education comprehension: verbalized understanding, returned demonstration, verbal cues required, and needs further education  HOME EXERCISE PROGRAM: Resonant voice homework with use of "target words" to center herself with flow and correct resonance.  GOALS: Goals reviewed with patient? Yes  SHORT TERM GOALS: Target date: 09/22/23  Pt will perform at least 3 voice  exercises in session, to improve voice quality with 85% success, for 3 sessions Baseline: 09/02/23 Goal status: INITIAL  2.  In sentence responses, pt will produce WNL voicing 85% of the time over three sessions Baseline:  Goal status: INITIAL  3.  Pt will maintain AB in sentence responses 85% of the time, in 3 sessions Baseline:  Goal status: INITIAL    LONG TERM GOALS: Target date: 10/30/23  Pt will improve PROM scores compared to initial administration Baseline:  Goal status: INITIAL  2.  In 15 minutes mod complex conversation, pt will produce WNL voicing 85% of the time over three sessions Baseline:  Goal status: INITIAL  3.  Pt will maintain AB 85% of the time, in 15 minutes mod complex conversation  in 3 sessions Baseline:  Goal status: INITIAL  4. Pt will indicate that her voice does not feel tired until after 5pm, over 3 consecutive  sessions Baseline:  Goal status: INITIAL  ASSESSMENT:  CLINICAL IMPRESSION: Patient is a 52 y.o. F who was seen today for treatment of voice quality and  voice use. See "today's treatment" for more details. She cont to gain more experience with WNL voicing in structured tasks.   OBJECTIVE IMPAIRMENTS: include voice disorder. These impairments are limiting patient from ADLs/IADLs, effectively communicating at home and in community, and while at work . Factors affecting potential to achieve goals and functional outcome are  her occupational needs using her voice . Patient will benefit from skilled SLP services to address above impairments and improve overall function.  REHAB POTENTIAL: Good  PLAN:  SLP FREQUENCY: 2x/week  SLP DURATION: 8 weeks  PLANNED INTERVENTIONS: 92507 Treatment of speech (30 or 45 min) , Internal/external aids, Functional tasks, SLP instruction and feedback, Compensatory strategies, Patient/family education, and voice exercises.    Shontell Prosser, CCC-SLP 09/02/2023, 10:58 AM

## 2023-09-08 ENCOUNTER — Ambulatory Visit: Payer: BC Managed Care – PPO | Attending: Otolaryngology

## 2023-09-08 DIAGNOSIS — R490 Dysphonia: Secondary | ICD-10-CM | POA: Diagnosis not present

## 2023-09-08 NOTE — Therapy (Signed)
OUTPATIENT SPEECH LANGUAGE PATHOLOGY VOICE TREATMENT   Patient Name: [name] MRN: [id number] DOB:1971/09/30, 52 y.o., female Today's Date: 09/08/2023  PCP: Merri Brunette, MD REFERRING PROVIDER: Cheron Schaumann, MD  END OF SESSION:  End of Session - 09/08/23 1711     Visit Number 4    Number of Visits 17    Date for SLP Re-Evaluation 10/30/23    SLP Start Time 1620    SLP Stop Time  1641    SLP Time Calculation (min) 21 min    Activity Tolerance Patient tolerated treatment well               Past Medical History:  Diagnosis Date   Diverticulitis 02/2021   Hypothyroidism    Kidney disease    Past Surgical History:  Procedure Laterality Date   back procedure  2008   Patient Active Problem List   Diagnosis Date Noted   Amenorrhea 12/24/2021   Chronic renal failure syndrome 12/24/2021   Diverticular disease of colon 12/24/2021   Diverticulitis 12/24/2021   Menopause 12/24/2021   Osteoarthritis of right knee 09/21/2018    Onset date: 2 years ago; script   REFERRING DIAG: Muscle Tension Dysphonia Chronic Cough  THERAPY DIAG:  Hoarseness  Rationale for Evaluation and Treatment: Rehabilitation  SUBJECTIVE:   SUBJECTIVE STATEMENT: "This will be interesting." (Pt acknowledged sore throat yesterday, 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 female who presents as a new patient, referred by No ref. provider found, for evaluation and treatment of persistent cough. She has been experiencing this cough for nearly 2 years, initially attributing it to a common cold. Despite trying various cold remedies, the cough persisted. She was then diagnosed with Gastroesophageal Reflux Disease (GERD) and underwent an endoscopy, which yielded normal results. Despite taking reflux medication, she reports minimal  relief. She takes two Prilosec tablets in the morning and two at night, but these have not alleviated her symptoms. Her cough is dry, and she often needs to clear her throat. She is currently seeking to identify the cause of her nighttime cough. She has not yet undergone allergy testing. She reports no history of smoking, tobacco use, or vaping, and does not have asthma. She reports no shortness of breath or difficulty breathing, and there are no concerns for obstructive sleep apnea.    PAIN:  Are you having pain? No  FALLS: Has patient fallen in last 6 months? No,   PATIENT GOALS: Improve voice   OBJECTIVE:  Note: Objective measures were completed at Evaluation unless otherwise noted.  DIAGNOSTIC FINDINGS:  After adequate topical anesthetic was applied, 4 mm flexible laryngoscope was passed through the nasal cavity without difficulty. Flexible laryngoscopy shows patent anterior nasal cavity with minimal crusting, no discharge or infection.  Normal base of tongue and supraglottis Normal vocal cord mobility without vocal cord nodule, mass, polyp or tumor. Deeper glottic compression noted with sustained phonation, consistent with muscle tension dysphonia. Hypopharynx normal without mass, pooling of secretions or aspiration.    PATIENT REPORTED OUTCOME MEASURES (PROM): V-RQOL:   and VHI: completed 09/02/23. Pt scored herself VHI as mild (29), and her V-RQOL was scored  "good" (not excellent).   TODAY'S TREATMENT:       AB=Abdominal Breathing, MTD= Muscle Tension Dysphonia  DATE:  09/08/23: Due to pt's vocal fatigue today, she was agreeable to shortening session. Pt demonstrated to SLP what she is practicing at home and took 5 reps to "warm up" but demonstrated WNL voicing (no fry or sx of MTD) using AB 85% of the time. Pt cleared throat 9 times in session today and  SLP provided throat clear alternatives and practiced these with pt.  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  SLP began working with pt on decreasing vocal fry by having her repeat final vowel words (idea, memo, review, etc). Pt eventually was 90% with these words. "It feels like I'm doing a crunch" pt stated, due to improved abdominal recruitment. SLP then practiced "forward voice" instead of "harsh voice" - SLP did some auditory examples and after 2 examples pt began to hear difference. SLP had pt repeat 2-4 word phrases practicing forward voice/new voice. SLP also had to cue pt for vocal fry in these phrases and eventually achieved 90% success with repeating phrases. SLP educated pt about "target words" with nasals, and /u/ and /o/ vowels and told pt to use these if she notes she is using a lot of harsh voice. Pt will practice beginning with target words, then saying words or two-word combinations targeting a forward voice.   08/31/23: Pt needs V-RQOL and VHI next session. Pt is improved on her water intake since last session. AB at rest 95%+ for 3 minutes. SLP had her practice resonant voicing with /u/ and /m/ phonemes, and then words with /u/ and /m/ using AB. Pt 85% successful with WNL voicing. Then progressed to /u/ words with fricatives and sentences with /u/ and fricatives with pt demonstrating 80% success with WNL voicing. SLP then had pt repeat words from monosyllabic word list to focus on making WNL voicing with word level production - pt 80% successful. SLP provided pt with monosyllabic words to practice.   08/24/23 (eval): SLP educated pt about AB and rationale for using this when talking. SLP worked with pt to obtain AB while breathing at rest. Pt succeeded at rest in seated position 90% of the time. Lastly, SLP taught pt about straw phonation procedure and rationale. Pt with WNL voicing quality with  straw phonation 80-85%, and with "ooo" ~70% of attempts (4/6).  PATIENT EDUCATION: Education details: see "today's treatment" Person educated: Patient Education method: Explanation, Demonstration, Verbal cues, and Handouts Education comprehension: verbalized understanding, returned demonstration, verbal cues required, and needs further education  HOME EXERCISE PROGRAM: Resonant voice homework with use of "target words" to center herself with flow and correct resonance.  GOALS: Goals reviewed with patient? Yes  SHORT TERM GOALS: Target date: 09/22/23  Pt will perform at least 3 voice exercises in session, to improve voice quality with 85% success, for 3 sessions Baseline: 09/02/23 Goal status: INITIAL  2.  In sentence responses, pt will produce WNL voicing 85% of the time over three sessions Baseline:  Goal status: INITIAL  3.  Pt will maintain AB in sentence responses 85% of the time, in 3 sessions Baseline:  Goal status: INITIAL    LONG TERM GOALS: Target date: 10/30/23  Pt will improve PROM scores compared to initial administration Baseline:  Goal status: INITIAL  2.  In 15 minutes mod complex conversation, pt will produce WNL voicing 85% of the time over three sessions Baseline:  Goal status: INITIAL  3.  Pt will maintain  AB 85% of the time, in 15 minutes mod complex conversation  in 3 sessions Baseline:  Goal status: INITIAL  4. Pt will indicate that her voice does not feel tired until after 5pm, over 3 consecutive  sessions Baseline:  Goal status: INITIAL  ASSESSMENT:  CLINICAL IMPRESSION: Patient is a 52 y.o. F who was seen today for treatment of voice quality and voice use. See "today's treatment" for more details. She cont to gain more experience with WNL voicing in structured tasks.   OBJECTIVE IMPAIRMENTS: include voice disorder. These impairments are limiting patient from ADLs/IADLs, effectively communicating at home and in community, and while at work  . Factors affecting potential to achieve goals and functional outcome are  her occupational needs using her voice . Patient will benefit from skilled SLP services to address above impairments and improve overall function.  REHAB POTENTIAL: Good  PLAN:  SLP FREQUENCY: 2x/week  SLP DURATION: 8 weeks  PLANNED INTERVENTIONS: 92507 Treatment of speech (30 or 45 min) , Internal/external aids, Functional tasks, SLP instruction and feedback, Compensatory strategies, Patient/family education, and voice exercises.    Deston Bilyeu, CCC-SLP 09/08/2023, 5:11 PM

## 2023-09-08 NOTE — Patient Instructions (Signed)

## 2023-09-09 DIAGNOSIS — M170 Bilateral primary osteoarthritis of knee: Secondary | ICD-10-CM | POA: Diagnosis not present

## 2023-09-10 ENCOUNTER — Ambulatory Visit: Payer: BC Managed Care – PPO

## 2023-09-10 ENCOUNTER — Ambulatory Visit: Payer: BC Managed Care – PPO | Admitting: Allergy

## 2023-09-10 ENCOUNTER — Encounter: Payer: Self-pay | Admitting: Allergy

## 2023-09-10 DIAGNOSIS — J3089 Other allergic rhinitis: Secondary | ICD-10-CM

## 2023-09-10 DIAGNOSIS — J310 Chronic rhinitis: Secondary | ICD-10-CM

## 2023-09-10 DIAGNOSIS — R0982 Postnasal drip: Secondary | ICD-10-CM

## 2023-09-10 NOTE — Progress Notes (Signed)
Follow-up Note  RE: [name] MRN: [id number] DOB: [birth year] Date of Office Visit: 09/10/2023   History of present illness: [name] is a 52 y.o. female presenting today for skin testing visit.  She was last seen in the office on 09/02/2023 for chronic rhinitis by myself.  She is in her usual state of health today.  She has held antihistamines for at least 3 days for testing today.  Medication List: Current Outpatient Medications  Medication Sig Dispense Refill   cetirizine (ZYRTEC) 10 MG chewable tablet Chew 10 mg by mouth daily.     Cholecalciferol (VITAMIN D) 50 MCG (2000 UT) CAPS      escitalopram (LEXAPRO) 10 MG tablet Take 5 mg by mouth daily.     estradiol (VIVELLE-DOT) 0.1 MG/24HR patch Place 1 patch (0.1 mg total) onto the skin 2 (two) times a week. 24 patch 4   fluticasone (FLONASE) 50 MCG/ACT nasal spray Place 2 sprays into both nostrils daily. BID morning and night     Magnesium Gluconate (MAGNESIUM 27 PO) 500mg      montelukast (SINGULAIR) 10 MG tablet Take 10 mg by mouth at bedtime. At night     NONFORMULARY OR COMPOUNDED ITEM Testosterone PLO or amyhourous base 4%  #30 gms Apply a pea size amount at bedtime to the inner thigh. 2 refills 30 each 2   Olopatadine-Mometasone (RYALTRIS) 665-25 MCG/ACT SUSP Place 2 sprays into the nose 2 (two) times daily as needed (nasal drainage or congestion). 29 g 5   Probiotic Product (PROBIOTIC PO) Take by mouth.     progesterone (PROMETRIUM) 200 MG capsule Take 1 capsule (200 mg total) by mouth daily. See admin instructions. 90 capsule 4   thyroid (NP THYROID) 15 MG tablet Take 1 tablet (15 mg total) by mouth daily. 90 tablet 4   No current facility-administered medications for this visit.     Known medication allergies: No Known Allergies Diagnositics/Labs:  Allergy testing:   Airborne Adult Perc - 09/10/23 1414     Time Antigen Placed 1414    Allergen Manufacturer Waynette Buttery    Location Back     Number of Test 55    Panel 1 Select    1. Control-Buffer 50% Glycerol Negative    2. Control-Histamine 2+    3. Bahia 2+    4. [place] 2+    5. Johnson Negative    6. Kentucky Blue Negative    7. Meadow Fescue 2+    8. Perennial Rye Negative    9. Timothy Negative    10. Ragweed Mix Negative    11. Cocklebur Negative    12. Plantain,  English Negative    13. Baccharis Negative    14. Dog Fennel 2+    15. Russian Thistle Negative    16. Lamb's Quarters Negative    17. Sheep Sorrell Negative    18. Rough Pigweed Negative    19. Marsh Elder, Rough Negative    20. Mugwort, Common Negative    21. Box, Elder 2+    22. Cedar, red Negative    23. Sweet Gum Negative    24. Pecan Pollen Negative    25. Pine Mix Negative    26. Walnut, Black Pollen Negative    27. Red Mulberry Negative    28. Ash Mix Negative    29. Birch Mix 2+    30. Beech American Negative    31. Cottonwood, [place] Negative    32. Hickory, White Negative  33. Maple Mix Negative    34. Oak, [place] Mix Negative    35. Sycamore Eastern Negative    36. Alternaria Alternata Negative    37. Cladosporium Herbarum Negative    38. Aspergillus Mix Negative    39. Penicillium Mix Negative    40. Bipolaris Sorokiniana (Helminthosporium) Negative    41. Drechslera Spicifera (Curvularia) Negative    42. Mucor Plumbeus Negative    43. Fusarium Moniliforme Negative    44. Aureobasidium Pullulans (pullulara) Negative    45. Rhizopus Oryzae 2+    46. Botrytis Cinera Negative    47. Epicoccum Nigrum Negative    48. Phoma Betae Negative    49. Dust Mite Mix 3+    50. Cat Hair 10,000 BAU/ml Negative    51.  Dog Epithelia Negative    52. Mixed Feathers 2+    53. Horse Epithelia Negative    54. Cockroach, German Negative    55. Tobacco Leaf Negative             Allergy testing results were read and interpreted by provider, documented by clinical staff.   Assessment and plan: Rhinitis with post-nasal  drip Chronic symptoms of postnasal drainage, particularly at night, leading to sleep disturbances. Currently managed with Singulair, Flonase, and Zyrtec with significant improvement. Evidence of allergies noted on previous examination by ENT. -Continue Singulair and Zyrtec daily. -Use Ryaltris nasal spray 2 sprays 1-2 times daily for control of drainage and congestion.  This is a combination spray with olopatadine for drainage control and mometasone for congestion drainage.  Hold Flonase while using Ryaltris.   -Recommend nasal saline flush.  Use saline rinse kit prior to nasal spray use.  Use distilled water or boil water and bring to room temperature (do not use tap water).  Keep mouth open during entirety of rinse.   -Resume Zyrtec 10mg  daily use -Testing today showed: grasses, weeds, trees, outdoor molds, dust mites, and mixed feathers - Copy of test results provided.  - Avoidance measures provided. - Consider allergy shots as a means of long-term control. - Allergy shots "re-train" and "reset" the immune system to ignore environmental allergens and decrease the resulting immune response to those allergens (sneezing, itchy watery eyes, runny nose, nasal congestion, etc).    - Allergy shots improve symptoms in 75-85% of patients.  - We can discuss more at future appointment if the medications are not working for you.  Follow-up in 6 months or sooner if needed  I appreciate the opportunity to take part in Luvenia's care. Please do not hesitate to contact me with questions.  Sincerely,   Margo Aye, MD Allergy/Immunology [facility]

## 2023-09-10 NOTE — Patient Instructions (Addendum)
Rhinitis with post-nasal drip Chronic symptoms of postnasal drainage, particularly at night, leading to sleep disturbances. Currently managed with Singulair, Flonase, and Zyrtec with significant improvement. Evidence of allergies noted on previous examination by ENT. -Continue Singulair and Zyrtec daily. -Use Ryaltris nasal spray 2 sprays 1-2 times daily for control of drainage and congestion.  This is a combination spray with olopatadine for drainage control and mometasone for congestion drainage.  Hold Flonase while using Ryaltris.   -Recommend nasal saline flush.  Use saline rinse kit prior to nasal spray use.  Use distilled water or boil water and bring to room temperature (do not use tap water).  Keep mouth open during entirety of rinse.   -Resume Zyrtec 10mg  daily use -Testing today showed: grasses, weeds, trees, outdoor molds, dust mites, and mixed feathers - Copy of test results provided.  - Avoidance measures provided. - Consider allergy shots as a means of long-term control. - Allergy shots "re-train" and "reset" the immune system to ignore environmental allergens and decrease the resulting immune response to those allergens (sneezing, itchy watery eyes, runny nose, nasal congestion, etc).    - Allergy shots improve symptoms in 75-85% of patients.  - We can discuss more at future appointment if the medications are not working for you.  Follow-up in 6 months or sooner if needed

## 2023-09-14 ENCOUNTER — Ambulatory Visit: Payer: BC Managed Care – PPO

## 2023-09-14 DIAGNOSIS — R490 Dysphonia: Secondary | ICD-10-CM

## 2023-09-14 NOTE — Therapy (Signed)
OUTPATIENT SPEECH LANGUAGE PATHOLOGY VOICE TREATMENT   Patient Name: [name] MRN: [id number] DOB:[birth year], 52 y.o., female Today's Date: 09/14/2023  PCP: Merri Brunette, MD REFERRING PROVIDER: Cheron Schaumann, MD  END OF SESSION:  End of Session - 09/14/23 0931     Visit Number 5    Number of Visits 17    Date for SLP Re-Evaluation 10/30/23    SLP Start Time 0805    SLP Stop Time  0845    SLP Time Calculation (min) 40 min    Activity Tolerance Patient tolerated treatment well                Past Medical History:  Diagnosis Date   Diverticulitis 02/2021   Hypothyroidism    Kidney disease    Past Surgical History:  Procedure Laterality Date   back procedure  2008   Patient Active Problem List   Diagnosis Date Noted   Amenorrhea 12/24/2021   Chronic renal failure syndrome 12/24/2021   Diverticular disease of colon 12/24/2021   Diverticulitis 12/24/2021   Menopause 12/24/2021   Osteoarthritis of right knee 09/21/2018    Onset date: 2 years ago; script   REFERRING DIAG: Muscle Tension Dysphonia Chronic Cough  THERAPY DIAG:  Hoarseness  Rationale for Evaluation and Treatment: Rehabilitation  SUBJECTIVE:   SUBJECTIVE STATEMENT: Pt found out she is most allergic to dust mites.  Pt accompanied by: self  PERTINENT HISTORY:  [name] is a 52 y.o. female who presents as a new patient, referred by No ref. provider found, for evaluation and treatment of persistent cough. She has been experiencing this cough for nearly 2 years, initially attributing it to a common cold. Despite trying various cold remedies, the cough persisted. She was then diagnosed with Gastroesophageal Reflux Disease (GERD) and underwent an endoscopy, which yielded normal results. Despite taking reflux medication, she reports minimal relief. She takes two Prilosec tablets in the morning and two at night, but these have not alleviated her symptoms. Her cough is dry,  and she often needs to clear her throat. She is currently seeking to identify the cause of her nighttime cough. She has not yet undergone allergy testing. She reports no history of smoking, tobacco use, or vaping, and does not have asthma. She reports no shortness of breath or difficulty breathing, and there are no concerns for obstructive sleep apnea.    PAIN:  Are you having pain? No  FALLS: Has patient fallen in last 6 months? No,   PATIENT GOALS: Improve voice   OBJECTIVE:  Note: Objective measures were completed at Evaluation unless otherwise noted.  DIAGNOSTIC FINDINGS:  After adequate topical anesthetic was applied, 4 mm flexible laryngoscope was passed through the nasal cavity without difficulty. Flexible laryngoscopy shows patent anterior nasal cavity with minimal crusting, no discharge or infection.  Normal base of tongue and supraglottis Normal vocal cord mobility without vocal cord nodule, mass, polyp or tumor. Deeper glottic compression noted with sustained phonation, consistent with muscle tension dysphonia. Hypopharynx normal without mass, pooling of secretions or aspiration.    PATIENT REPORTED OUTCOME MEASURES (PROM): V-RQOL:   and VHI: completed 09/02/23. Pt scored herself VHI as mild (29), and her V-RQOL was scored  "good" (not excellent).   TODAY'S TREATMENT:       AB=Abdominal Breathing, MTD= Muscle Tension Dysphonia  DATE:  09/14/23: SLP and pt figured that her overuse with her voice on Friday with dust on Saturday with voice overuse/abuse on Sunday (mostly due to Friday and Saturday factors) lead to her difficulty with a coughing attack and subsequently needing a lay leader to come up and give the final liturgy. SLP and pt worked today with pt's work-like vocalizations - pt had chest breathing and abnormally loud voice. "I probably do this  all the time on Sundays," she said. Reading paragraphs pt had WNL voice 80% of the time, one instance of vocal fry, unnoticed. 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 She rec'd homework for multiple sentence responses. SLP told pt to think of three things; voice produced right in front of her mouth to think forward voice, no vocal fry "finish my words" pt stated, and AB.  09/08/23: Due to pt's vocal fatigue today, she was agreeable to shortening session. Pt demonstrated to SLP what she is practicing at home and took 5 reps to "warm up" but demonstrated WNL voicing (no fry or sx of MTD) using AB 85% of the time. Pt cleared throat 9 times in session today and SLP provided throat clear alternatives and practiced these with pt.  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  SLP began working with pt on decreasing vocal fry by having her repeat final vowel words (idea, memo, review, etc). Pt eventually was 90% with these words. "It feels like I'm doing a crunch" pt stated, due to improved abdominal recruitment. SLP then practiced "forward voice" instead of "harsh voice" - SLP did some auditory examples and after 2 examples pt began to hear difference. SLP had pt repeat 2-4 word phrases practicing forward voice/new voice. SLP also had to cue pt for vocal fry in these phrases and eventually achieved 90% success with repeating phrases. SLP educated pt about "target words" with nasals, and /u/ and /o/ vowels and told pt to use these if she notes she is using a lot of harsh voice. Pt will practice beginning with target words, then saying words or two-word combinations targeting a forward voice.   08/31/23: Pt needs V-RQOL and VHI next session. Pt is improved on her water intake since last session. AB at rest 95%+  for 3 minutes. SLP had her practice resonant voicing with /u/ and /m/ phonemes, and then words with /u/ and /m/ using AB. Pt 85% successful with WNL voicing. Then progressed to /u/ words with fricatives and sentences with /u/ and fricatives with pt demonstrating 80% success with WNL voicing. SLP then had pt repeat words from monosyllabic word list to focus on making WNL voicing with word level production - pt 80% successful. SLP provided pt with monosyllabic words to practice.   08/24/23 (eval): SLP educated pt about AB and rationale for using this when talking. SLP worked with pt to obtain AB while breathing at rest. Pt succeeded at rest in seated position 90% of the time. Lastly, SLP taught pt about straw phonation procedure and rationale. Pt with WNL voicing quality with straw phonation 80-85%, and with "ooo" ~70% of attempts (4/6).  PATIENT EDUCATION: Education details: see "today's treatment" Person educated: Patient Education method: Explanation, Demonstration, Verbal cues, and Handouts Education comprehension: verbalized understanding, returned demonstration, verbal cues required, and needs further education  HOME EXERCISE PROGRAM: Practice "new voice" with multiple sentence responses.  GOALS: Goals reviewed with patient? Yes  SHORT TERM GOALS: Target date: 09/22/23  Pt will perform at least 3 voice exercises in session, to improve voice quality with 85% success, for 3 sessions Baseline: 09/02/23 Goal status: INITIAL  2.  In sentence responses, pt will produce WNL voicing 85% of the time over three sessions Baseline:  Goal status: INITIAL  3.  Pt will maintain AB in sentence responses 85% of the time, in 3 sessions Baseline:  Goal status: INITIAL    LONG TERM GOALS: Target date: 10/30/23  Pt will improve PROM scores compared to initial administration Baseline:  Goal status: INITIAL  2.  In 15 minutes mod complex conversation, pt will produce WNL voicing 85% of the time  over three sessions Baseline:  Goal status: INITIAL  3.  Pt will maintain AB 85% of the time, in 15 minutes mod complex conversation  in 3 sessions Baseline:  Goal status: INITIAL  4. Pt will indicate that her voice does not feel tired until after 5pm, over 3 consecutive  sessions Baseline:  Goal status: INITIAL  ASSESSMENT:  CLINICAL IMPRESSION: Patient is a 51 y.o. F who was seen today for treatment of voice quality and voice use. See "today's treatment" for more details. She cont to gain more experience with WNL voicing in structured tasks.   OBJECTIVE IMPAIRMENTS: include voice disorder. These impairments are limiting patient from ADLs/IADLs, effectively communicating at home and in community, and while at work . Factors affecting potential to achieve goals and functional outcome are  her occupational needs using her voice . Patient will benefit from skilled SLP services to address above impairments and improve overall function.  REHAB POTENTIAL: Good  PLAN:  SLP FREQUENCY: 2x/week  SLP DURATION: 8 weeks  PLANNED INTERVENTIONS: 92507 Treatment of speech (30 or 45 min) , Internal/external aids, Functional tasks, SLP instruction and feedback, Compensatory strategies, Patient/family education, and voice exercises.    Orma Cheetham, CCC-SLP 09/14/2023, 10:58 AM

## 2023-09-16 ENCOUNTER — Ambulatory Visit: Payer: BC Managed Care – PPO

## 2023-09-16 DIAGNOSIS — R490 Dysphonia: Secondary | ICD-10-CM

## 2023-09-16 NOTE — Therapy (Unsigned)
OUTPATIENT SPEECH LANGUAGE PATHOLOGY VOICE TREATMENT   Patient Name: [name] MRN: [id number] DOB:Aug 09, 1971, 52 y.o., female Today's Date: 09/17/2023  PCP: Merri Brunette, MD REFERRING PROVIDER: Cheron Schaumann, MD  END OF SESSION:  End of Session - 09/17/23 0905     Visit Number 6    Number of Visits 17    Date for SLP Re-Evaluation 10/30/23    SLP Start Time 0850    SLP Stop Time  0930    SLP Time Calculation (min) 40 min    Activity Tolerance Patient tolerated treatment well                 Past Medical History:  Diagnosis Date   Diverticulitis 02/2021   Hypothyroidism    Kidney disease    Past Surgical History:  Procedure Laterality Date   back procedure  2008   Patient Active Problem List   Diagnosis Date Noted   Amenorrhea 12/24/2021   Chronic renal failure syndrome 12/24/2021   Diverticular disease of colon 12/24/2021   Diverticulitis 12/24/2021   Menopause 12/24/2021   Osteoarthritis of right knee 09/21/2018    Onset date: 2 years ago; script   REFERRING DIAG: Muscle Tension Dysphonia Chronic Cough  THERAPY DIAG:  Hoarseness  Rationale for Evaluation and Treatment: Rehabilitation  SUBJECTIVE:   SUBJECTIVE STATEMENT: Pt found out she is most allergic to dust mites.  Pt accompanied by: self  PERTINENT HISTORY:  [name] is a 52 y.o. female who presents as a new patient, referred by No ref. provider found, for evaluation and treatment of persistent cough. She has been experiencing this cough for nearly 2 years, initially attributing it to a common cold. Despite trying various cold remedies, the cough persisted. She was then diagnosed with Gastroesophageal Reflux Disease (GERD) and underwent an endoscopy, which yielded normal results. Despite taking reflux medication, she reports minimal relief. She takes two Prilosec tablets in the morning and two at night, but these have not alleviated her symptoms. Her cough is  dry, and she often needs to clear her throat. She is currently seeking to identify the cause of her nighttime cough. She has not yet undergone allergy testing. She reports no history of smoking, tobacco use, or vaping, and does not have asthma. She reports no shortness of breath or difficulty breathing, and there are no concerns for obstructive sleep apnea.    PAIN:  Are you having pain? No  FALLS: Has patient fallen in last 6 months? No,   PATIENT GOALS: Improve voice   OBJECTIVE:  Note: Objective measures were completed at Evaluation unless otherwise noted.  DIAGNOSTIC FINDINGS:  After adequate topical anesthetic was applied, 4 mm flexible laryngoscope was passed through the nasal cavity without difficulty. Flexible laryngoscopy shows patent anterior nasal cavity with minimal crusting, no discharge or infection.  Normal base of tongue and supraglottis Normal vocal cord mobility without vocal cord nodule, mass, polyp or tumor. Deeper glottic compression noted with sustained phonation, consistent with muscle tension dysphonia. Hypopharynx normal without mass, pooling of secretions or aspiration.    PATIENT REPORTED OUTCOME MEASURES (PROM): V-RQOL:   and VHI: completed 09/02/23. Pt scored herself VHI as mild (29), and her V-RQOL was scored  "good" (not excellent).   TODAY'S TREATMENT:       AB=Abdominal Breathing, MTD= Muscle Tension Dysphonia  DATE:  09/16/23: Pt communicated to SLP that the last 1-2 days her voice gets worse as she cont to talk. Pt "voice completely gave out" yesterday during prayer with a church member. Pt concerned about this. She entered today with approx 75% harsh/pressed voice quality and thoracic breathing so SLP had pt practice AB at rest for 3 minutes and then perform flow phonation tasks and resonant voice. Pt's voice became more WNL  quality after this practice. SLP targeted non-MTD voice in sentences (reading). Pt with vocal fry 10% of the time which she was aware of and repeated sentences to achieve WNL voicing on second attempt. SLP reiterated to pt that she needed to cont to practice WNL voice quality at home. Pt never had aphonia during today's session. SLP stressed to pt to think about AB, voice coming forward instead of caught in her throat, and keeping voice up/no vocal fry. SLP performed negative examples and pt ID 'd correctly.  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 SLP and pt worked today with pt's work-like vocalizations - pt had chest breathing and abnormally loud voice. "I probably do this all the time on Sundays," she said. Reading paragraphs pt had WNL voice 80% of the time, one instance of vocal fry, unnoticed. 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 She rec'd homework for multiple sentence responses. SLP told pt to think of three things; voice produced right in front of her mouth to think forward voice, no vocal fry "finish my words" pt stated, and AB.  09/08/23: Due to pt's vocal fatigue today, she was agreeable to shortening session. Pt demonstrated to SLP what she is practicing at home and took 5 reps to "warm up" but demonstrated WNL voicing (no fry or sx of MTD) using AB 85% of the time. Pt cleared throat 9 times in session today and SLP provided throat clear alternatives and practiced these with pt.  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  SLP began  working with pt on decreasing vocal fry by having her repeat final vowel words (idea, memo, review, etc). Pt eventually was 90% with these words. "It feels like I'm doing a crunch" pt stated, due to improved abdominal recruitment. SLP then practiced "forward voice" instead of "harsh voice" - SLP did some auditory examples and after 2 examples pt began to hear difference. SLP had pt repeat 2-4 word phrases practicing forward voice/new voice. SLP also had to cue pt for vocal fry in these phrases and eventually achieved 90% success with repeating phrases. SLP educated pt about "target words" with nasals, and /u/ and /o/ vowels and told pt to use these if she notes she is using a lot of harsh voice. Pt will practice beginning with target words, then saying words or two-word combinations targeting a forward voice.   08/31/23: Pt needs V-RQOL and VHI next session. Pt is improved on her water intake since last session. AB at rest 95%+ for 3 minutes. SLP had her practice resonant voicing with /u/ and /m/ phonemes, and then  words with /u/ and /m/ using AB. Pt 85% successful with WNL voicing. Then progressed to /u/ words with fricatives and sentences with /u/ and fricatives with pt demonstrating 80% success with WNL voicing. SLP then had pt repeat words from monosyllabic word list to focus on making WNL voicing with word level production - pt 80% successful. SLP provided pt with monosyllabic words to practice.   08/24/23 (eval): SLP educated pt about AB and rationale for using this when talking. SLP worked with pt to obtain AB while breathing at rest. Pt succeeded at rest in seated position 90% of the time. Lastly, SLP taught pt about straw phonation procedure and rationale. Pt with WNL voicing quality with straw phonation 80-85%, and with "ooo" ~70% of attempts (4/6).  PATIENT EDUCATION: Education details: see "today's treatment" Person educated: Patient Education method: Explanation, Demonstration, Verbal cues, and  Handouts Education comprehension: verbalized understanding, returned demonstration, verbal cues required, and needs further education  HOME EXERCISE PROGRAM: Practice "new voice" with multiple sentence responses.  GOALS: Goals reviewed with patient? Yes  SHORT TERM GOALS: Target date: 09/22/23  Pt will perform at least 3 voice exercises in session, to improve voice quality with 85% success, for 3 sessions Baseline: 09/02/23 Goal status: INITIAL  2.  In sentence responses, pt will produce WNL voicing 85% of the time over three sessions Baseline:  Goal status: INITIAL  3.  Pt will maintain AB in sentence responses 85% of the time, in 3 sessions Baseline:  Goal status: INITIAL    LONG TERM GOALS: Target date: 10/30/23  Pt will improve PROM scores compared to initial administration Baseline:  Goal status: INITIAL  2.  In 15 minutes mod complex conversation, pt will produce WNL voicing 85% of the time over three sessions Baseline:  Goal status: INITIAL  3.  Pt will maintain AB 85% of the time, in 15 minutes mod complex conversation  in 3 sessions Baseline:  Goal status: INITIAL  4. Pt will indicate that her voice does not feel tired until after 5pm, over 3 consecutive  sessions Baseline:  Goal status: INITIAL  ASSESSMENT:  CLINICAL IMPRESSION: Patient is a 52 y.o. F who was seen today for treatment of voice quality and voice use. See "today's treatment" for more details. She cont to gain more experience with WNL voicing in structured tasks.   OBJECTIVE IMPAIRMENTS: include voice disorder. These impairments are limiting patient from ADLs/IADLs, effectively communicating at home and in community, and while at work . Factors affecting potential to achieve goals and functional outcome are  her occupational needs using her voice . Patient will benefit from skilled SLP services to address above impairments and improve overall function.  REHAB POTENTIAL: Good  PLAN:  SLP  FREQUENCY: 2x/week  SLP DURATION: 8 weeks  PLANNED INTERVENTIONS: 92507 Treatment of speech (30 or 45 min) , Internal/external aids, Functional tasks, SLP instruction and feedback, Compensatory strategies, Patient/family education, and voice exercises.    Menucha Dicesare, CCC-SLP 09/17/2023, 9:06 AM

## 2023-09-21 ENCOUNTER — Ambulatory Visit: Payer: BC Managed Care – PPO

## 2023-09-21 DIAGNOSIS — R490 Dysphonia: Secondary | ICD-10-CM | POA: Diagnosis not present

## 2023-09-21 NOTE — Therapy (Signed)
OUTPATIENT SPEECH LANGUAGE PATHOLOGY VOICE TREATMENT   Patient Name: [name] MRN: [id number] DOB:[birth year], 52 y.o., female Today's Date: 09/21/2023  PCP: Merri Brunette, MD REFERRING PROVIDER: Cheron Schaumann, MD  END OF SESSION:  End of Session - 09/21/23 1710     Visit Number 7    Number of Visits 17    Date for SLP Re-Evaluation 10/30/23    SLP Start Time 1620    SLP Stop Time  1656    SLP Time Calculation (min) 36 min    Activity Tolerance Patient tolerated treatment well                  Past Medical History:  Diagnosis Date   Diverticulitis 02/2021   Hypothyroidism    Kidney disease    Past Surgical History:  Procedure Laterality Date   back procedure  2008   Patient Active Problem List   Diagnosis Date Noted   Amenorrhea 12/24/2021   Chronic renal failure syndrome 12/24/2021   Diverticular disease of colon 12/24/2021   Diverticulitis 12/24/2021   Menopause 12/24/2021   Osteoarthritis of right knee 09/21/2018    Onset date: 2 years ago; script   REFERRING DIAG: Muscle Tension Dysphonia Chronic Cough  THERAPY DIAG:  Hoarseness  Rationale for Evaluation and Treatment: Rehabilitation  SUBJECTIVE:   SUBJECTIVE STATEMENT: "Sunday went much better."  Pt accompanied by: self  PERTINENT HISTORY:  [name] is a 52 y.o. female who presents as a new patient, referred by No ref. provider found, for evaluation and treatment of persistent cough. She has been experiencing this cough for nearly 2 years, initially attributing it to a common cold. Despite trying various cold remedies, the cough persisted. She was then diagnosed with Gastroesophageal Reflux Disease (GERD) and underwent an endoscopy, which yielded normal results. Despite taking reflux medication, she reports minimal relief. She takes two Prilosec tablets in the morning and two at night, but these have not alleviated her symptoms. Her cough is dry, and she often  needs to clear her throat. She is currently seeking to identify the cause of her nighttime cough. She has not yet undergone allergy testing. She reports no history of smoking, tobacco use, or vaping, and does not have asthma. She reports no shortness of breath or difficulty breathing, and there are no concerns for obstructive sleep apnea.    PAIN:  Are you having pain? No  FALLS: Has patient fallen in last 6 months? No,   PATIENT GOALS: Improve voice   OBJECTIVE:  Note: Objective measures were completed at Evaluation unless otherwise noted.  DIAGNOSTIC FINDINGS:  After adequate topical anesthetic was applied, 4 mm flexible laryngoscope was passed through the nasal cavity without difficulty. Flexible laryngoscopy shows patent anterior nasal cavity with minimal crusting, no discharge or infection.  Normal base of tongue and supraglottis Normal vocal cord mobility without vocal cord nodule, mass, polyp or tumor. Deeper glottic compression noted with sustained phonation, consistent with muscle tension dysphonia. Hypopharynx normal without mass, pooling of secretions or aspiration.    PATIENT REPORTED OUTCOME MEASURES (PROM): V-RQOL:   and VHI: completed 09/02/23. Pt scored herself VHI as mild (29), and her V-RQOL was scored  "good" (not excellent).   TODAY'S TREATMENT:       AB=Abdominal Breathing, MTD= Muscle Tension Dysphonia  DATE:  09/21/23: "I went back to Flonase which I think is better for me" Pt has been focusing on keeping voice up throughout sentences, and focusing on using abdominal musculature. Yesterday she said she also used her voice inflection instead of hyperfunctional/throat voice and let the microphone do the amplification for her. Pt's voice sounds very good today, without pressed speech/trapping air voice, x3 vocal fry instances in first 6  minutes. Pt agrees she can decr to once/week beginning next week. For almost 8 minutes SLP had pt focus on flow phonation during conversation and "finishing her words" (no vocal fry) and pt had one instance. She will focus on 2-3 30-minute time frames per day in the next two weeks to eliminate vocal fry.   09/16/23: Pt communicated to SLP that the last 1-2 days her voice gets worse as she cont to talk. Pt "voice completely gave out" yesterday during prayer with a church member. Pt concerned about this. She entered today with approx 75% harsh/pressed voice quality and thoracic breathing so SLP had pt practice AB at rest for 3 minutes and then perform flow phonation tasks and resonant voice. Pt's voice became more WNL quality after this practice. SLP targeted non-MTD voice in sentences (reading). Pt with vocal fry 10% of the time which she was aware of and repeated sentences to achieve WNL voicing on second attempt. SLP reiterated to pt that she needed to cont to practice WNL voice quality at home. Pt never had aphonia during today's session. SLP stressed to pt to think about AB, voice coming forward instead of caught in her throat, and keeping voice up/no vocal fry. SLP performed negative examples and pt ID 'd correctly.  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 SLP and pt worked today with pt's work-like vocalizations - pt had chest breathing and abnormally loud voice. "I probably do this all the time on Sundays," she said. Reading paragraphs pt had WNL voice 80% of the time, one instance of vocal fry, unnoticed. 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 She rec'd homework for multiple sentence responses. SLP told pt to think of three things; voice produced right in front of her mouth to think forward voice, no vocal fry "finish my words" pt stated, and AB.  09/08/23: Due to pt's vocal fatigue today, she was agreeable to shortening session. Pt demonstrated to SLP what she is practicing at home and took 5 reps to "warm up" but demonstrated WNL voicing (no fry or sx of MTD) using AB 85% of the time. Pt cleared throat 9 times in session today and SLP provided throat clear alternatives and practiced these with pt.  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  SLP began working with pt on decreasing vocal fry by having her repeat final vowel words (idea, memo, review, etc). Pt eventually was 90% with these words. "It feels like I'm doing a crunch" pt stated, due to improved abdominal recruitment. SLP then practiced "forward voice" instead of "harsh voice" - SLP did some auditory examples and  after 2 examples pt began to hear difference. SLP had pt repeat 2-4 word phrases practicing forward voice/new voice. SLP also had to cue pt for vocal fry in these phrases and eventually achieved 90% success with repeating phrases. SLP educated pt about "target words" with nasals, and /u/ and /o/ vowels and told pt to use these if she notes she is using a lot of harsh voice. Pt will practice beginning with target words, then saying words or two-word combinations targeting a forward voice.   08/31/23: Pt needs V-RQOL and VHI next session. Pt is improved on her water intake since last session. AB at rest 95%+ for 3 minutes. SLP had her practice resonant voicing with /u/ and /m/ phonemes, and then words with /u/ and /m/ using AB. Pt 85% successful with WNL voicing. Then progressed to /u/ words with fricatives and sentences with /u/ and fricatives with pt demonstrating 80% success with  WNL voicing. SLP then had pt repeat words from monosyllabic word list to focus on making WNL voicing with word level production - pt 80% successful. SLP provided pt with monosyllabic words to practice.   08/24/23 (eval): SLP educated pt about AB and rationale for using this when talking. SLP worked with pt to obtain AB while breathing at rest. Pt succeeded at rest in seated position 90% of the time. Lastly, SLP taught pt about straw phonation procedure and rationale. Pt with WNL voicing quality with straw phonation 80-85%, and with "ooo" ~70% of attempts (4/6).  PATIENT EDUCATION: Education details: see "today's treatment" Person educated: Patient Education method: Explanation, Demonstration, Verbal cues, and Handouts Education comprehension: verbalized understanding, returned demonstration, verbal cues required, and needs further education  HOME EXERCISE PROGRAM: Practice "new voice" with multiple sentence responses.  GOALS: Goals reviewed with patient? Yes  SHORT TERM GOALS: Target date: 09/22/23  Pt will perform at least 3 voice exercises in session, to improve voice quality with 85% success, for 3 sessions Baseline: 09/02/23, 09/21/23 Goal status: Partially met  2.  In sentence responses, pt will produce WNL voicing 85% of the time over three sessions Baseline: 09/21/23 Goal status: partially met  3.  Pt will maintain AB in sentence responses 85% of the time, in 3 sessions Baseline: 09/21/23 Goal status: partially met    LONG TERM GOALS: Target date: 10/30/23  Pt will improve PROM scores compared to initial administration Baseline:  Goal status: INITIAL  2.  In 15 minutes mod complex conversation, pt will produce WNL voicing 85% of the time over three sessions Baseline:  Goal status: INITIAL  3.  Pt will maintain AB 85% of the time, in 15 minutes mod complex conversation  in 3 sessions Baseline:  Goal status: INITIAL  4. Pt will indicate that her voice does not feel  tired until after 5pm, over 3 consecutive  sessions Baseline:  Goal status: INITIAL  ASSESSMENT:  CLINICAL IMPRESSION: Patient is a 52 y.o. F who was seen today for treatment of voice quality and voice use. See "today's treatment" for more details. She cont to gain more experience with WNL voicing in conversation, and reports having more vocal stamina in church services.   OBJECTIVE IMPAIRMENTS: include voice disorder. These impairments are limiting patient from ADLs/IADLs, effectively communicating at home and in community, and while at work . Factors affecting potential to achieve goals and functional outcome are  her occupational needs using her voice . Patient will benefit from skilled SLP services to address above impairments and improve overall function.  REHAB POTENTIAL: Good  PLAN:  SLP FREQUENCY: 2x/week  SLP DURATION: 8 weeks  PLANNED INTERVENTIONS: 92507 Treatment of speech (30 or 45 min) , Internal/external aids, Functional tasks, SLP instruction and feedback, Compensatory strategies, Patient/family education, and voice exercises.    Berklie Dethlefs, CCC-SLP 09/21/2023, 5:10 PM

## 2023-09-23 ENCOUNTER — Ambulatory Visit: Payer: BC Managed Care – PPO

## 2023-10-05 ENCOUNTER — Ambulatory Visit: Payer: BC Managed Care – PPO

## 2023-10-05 DIAGNOSIS — R490 Dysphonia: Secondary | ICD-10-CM

## 2023-10-05 NOTE — Therapy (Signed)
OUTPATIENT SPEECH LANGUAGE PATHOLOGY VOICE TREATMENT/Discharge summary   Patient Name: [name] MRN: [id number] DOB:10-Nov-1970, 52 y.o., female Today's Date: 10/05/2023  PCP: Merri Brunette, MD REFERRING PROVIDER: Cheron Schaumann, MD  END OF SESSION:  End of Session - 10/05/23 0927     Visit Number 8    Number of Visits 17    Date for SLP Re-Evaluation 10/30/23    SLP Start Time 0851    SLP Stop Time  0923    SLP Time Calculation (min) 32 min    Activity Tolerance Patient tolerated treatment well                   Past Medical History:  Diagnosis Date   Diverticulitis 02/2021   Hypothyroidism    Kidney disease    Past Surgical History:  Procedure Laterality Date   back procedure  2008   Patient Active Problem List   Diagnosis Date Noted   Amenorrhea 12/24/2021   Chronic renal failure syndrome 12/24/2021   Diverticular disease of colon 12/24/2021   Diverticulitis 12/24/2021   Menopause 12/24/2021   Osteoarthritis of right knee 09/21/2018   SPEECH THERAPY DISCHARGE SUMMARY  Visits from Start of Care: 8  Current functional level related to goals / functional outcomes: See below for more details, but pt now states her voice is not tired after 5pm. She is more aware of vocal hygiene. She drinks more water now instead of clearing throat.   Remaining deficits: None noted   Education / Equipment: See pt notes below.   Patient agrees to discharge. Patient goals were partially met.(due to duration) Patient is being discharged due to being pleased with the current functional level..      Onset date: 2 years ago; script dated 08/12/23  REFERRING DIAG: Muscle Tension Dysphonia Chronic Cough  THERAPY DIAG:  Hoarseness  Rationale for Evaluation and Treatment: Rehabilitation  SUBJECTIVE:   SUBJECTIVE STATEMENT: "I worked on just talking like this (conversational volume)- I was a lot more relaxed. It was a whole body  experience."  Pt accompanied by: self  PERTINENT HISTORY:  [name] is a 52 y.o. female who presents as a new patient, referred by No ref. provider found, for evaluation and treatment of persistent cough. She has been experiencing this cough for nearly 2 years, initially attributing it to a common cold. Despite trying various cold remedies, the cough persisted. She was then diagnosed with Gastroesophageal Reflux Disease (GERD) and underwent an endoscopy, which yielded normal results. Despite taking reflux medication, she reports minimal relief. She takes two Prilosec tablets in the morning and two at night, but these have not alleviated her symptoms. Her cough is dry, and she often needs to clear her throat. She is currently seeking to identify the cause of her nighttime cough. She has not yet undergone allergy testing. She reports no history of smoking, tobacco use, or vaping, and does not have asthma. She reports no shortness of breath or difficulty breathing, and there are no concerns for obstructive sleep apnea.    PAIN:  Are you having pain? Yes: NPRS scale: 3/10 Pain location: throat Pain description: a little sore Aggravating factors: talking Relieving factors: resting  FALLS: Has patient fallen in last 6 months? No,   PATIENT GOALS: Improve voice   OBJECTIVE:  Note: Objective measures were completed at Evaluation unless otherwise noted.  DIAGNOSTIC FINDINGS:  After adequate topical anesthetic was applied, 4 mm flexible laryngoscope was passed through the nasal cavity without difficulty. Flexible  laryngoscopy shows patent anterior nasal cavity with minimal crusting, no discharge or infection.  Normal base of tongue and supraglottis Normal vocal cord mobility without vocal cord nodule, mass, polyp or tumor. Deeper glottic compression noted with sustained phonation, consistent with muscle tension dysphonia. Hypopharynx normal without mass, pooling of secretions or  aspiration.    PATIENT REPORTED OUTCOME MEASURES (PROM): V-RQOL:   and VHI: completed 09/02/23. Pt scored herself VHI as mild (29), and her V-RQOL was scored  "good" (not excellent).   TODAY'S TREATMENT:       AB=Abdominal Breathing, MTD= Muscle Tension Dysphonia                                                                                                                                   DATE:  10/05/23: In 25 minutes conversation pt noted to use AB at least 85% of the time with 3-4 instances of vocal fry; one was corrected. Today [name] reports that she has a little voice pain - SLP asked her what she needed to do because of this and she answered correctly - rest her voice for the rest of the day.  She states that her voice is not tired after 5pm anymore. In the last two weeks she has "been more intentional" about voice hygiene, breathing with AB, drinking water - "vocal hygiene things" she stated. Pt filled out PROMs and scored "good to excellent" V-RQOL - up from "good" initially, and VHI was 16 - down from 29 initially. Both have improved.  09/21/23: "I went back to Flonase which I think is better for me" Pt has been focusing on keeping voice up throughout sentences, and focusing on using abdominal musculature. Yesterday she said she also used her voice inflection instead of hyperfunctional/throat voice and let the microphone do the amplification for her. Pt's voice sounds very good today, without pressed speech/trapping air voice, x3 vocal fry instances in first 6 minutes. Pt agrees she can decr to once/week beginning next week. For almost 8 minutes SLP had pt focus on flow phonation during conversation and "finishing her words" (no vocal fry) and pt had one instance. She will focus on 2-3 30-minute time frames per day in the next two weeks to eliminate vocal fry.   09/16/23: Pt communicated to SLP that the last 1-2 days her voice gets worse as she cont to talk. Pt "voice completely gave out"  yesterday during prayer with a church member. Pt concerned about this. She entered today with approx 75% harsh/pressed voice quality and thoracic breathing so SLP had pt practice AB at rest for 3 minutes and then perform flow phonation tasks and resonant voice. Pt's voice became more WNL quality after this practice. SLP targeted non-MTD voice in sentences (reading). Pt with vocal fry 10% of the time which she was aware of and repeated sentences to achieve WNL voicing on second attempt. SLP reiterated to pt that she needed to cont  to practice WNL voice quality at home. Pt never had aphonia during today's session. SLP stressed to pt to think about AB, voice coming forward instead of caught in her throat, and keeping voice up/no vocal fry. SLP performed negative examples and pt ID 'd correctly.  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 SLP and pt worked today with pt's work-like vocalizations - pt had chest breathing and abnormally loud voice. "I probably do this all the time on Sundays," she said. Reading paragraphs pt had WNL voice 80% of the time, one instance of vocal fry, unnoticed. 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 She rec'd homework for multiple sentence responses. SLP told pt to think of three things; voice produced right in front of her mouth to think forward voice, no vocal fry "finish my words" pt stated, and AB.  09/08/23: Due to pt's vocal fatigue today, she was agreeable to shortening session. Pt demonstrated to SLP what she is practicing at home and took 5 reps to "warm up" but demonstrated WNL voicing (no fry or sx of MTD)  using AB 85% of the time. Pt cleared throat 9 times in session today and SLP provided throat clear alternatives and practiced these with pt.  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  SLP began working with pt on decreasing vocal fry by having her repeat final vowel words (idea, memo, review, etc). Pt eventually was 90% with these words. "It feels like I'm doing a crunch" pt stated, due to improved abdominal recruitment. SLP then practiced "forward voice" instead of "harsh voice" - SLP did some auditory examples and after 2 examples pt began to hear difference. SLP had pt repeat 2-4 word phrases practicing forward voice/new voice. SLP also had to cue pt for vocal fry in these phrases and eventually achieved 90% success with repeating phrases. SLP educated pt about "target words" with nasals, and /u/ and /o/ vowels and told pt to use these if she notes she is using a lot of harsh voice. Pt will practice beginning with target words, then saying words or two-word combinations targeting a forward voice.   08/31/23: Pt needs V-RQOL and VHI next session. Pt is improved on her water intake since last session. AB at rest 95%+ for 3 minutes. SLP had her practice resonant voicing with /u/ and /m/ phonemes, and then words with /u/ and /m/ using AB. Pt 85% successful with WNL voicing. Then progressed to /u/ words with fricatives and sentences with /u/ and fricatives with pt demonstrating 80% success with WNL voicing. SLP then had pt repeat words from monosyllabic word list to focus on making WNL voicing with word level production - pt 80% successful. SLP provided pt with monosyllabic words to practice.   08/24/23 (eval): SLP educated pt about AB and rationale for using this when talking. SLP worked with pt to obtain AB while breathing at rest. Pt succeeded at rest in seated position 90% of the time. Lastly, SLP taught pt about  straw phonation procedure and rationale. Pt with WNL  voicing quality with straw phonation 80-85%, and with "ooo" ~70% of attempts (4/6).  PATIENT EDUCATION: Education details: see "today's treatment" Person educated: Patient Education method: Explanation, Demonstration, Verbal cues, and Handouts Education comprehension: verbalized understanding, returned demonstration, verbal cues required, and needs further education  HOME EXERCISE PROGRAM: Practice "new voice" with multiple sentence responses.  GOALS: Goals reviewed with patient? Yes  SHORT TERM GOALS: Target date: 09/22/23  Pt will perform at least 3 voice exercises in session, to improve voice quality with 85% success, for 3 sessions Baseline: 09/02/23, 09/21/23 Goal status: Partially met  2.  In sentence responses, pt will produce WNL voicing 85% of the time over three sessions Baseline: 09/21/23 Goal status: partially met  3.  Pt will maintain AB in sentence responses 85% of the time, in 3 sessions Baseline: 09/21/23 Goal status: partially met    LONG TERM GOALS: Target date: 10/30/23  Pt will improve PROM scores compared to initial administration Baseline:  Goal status: Met  2.  In 15 minutes mod complex conversation, pt will produce WNL voicing 85% of the time over three sessions Baseline: 10/05/23 Goal status: partially met  3.  Pt will maintain AB 85% of the time, in 15 minutes mod complex conversation  in 3 sessions Baseline: 10/05/23 Goal status: partially met  4. Pt will indicate that her voice does not feel tired until after 5pm, over 3 consecutive  sessions Baseline: 10/05/23 Goal status: partially met  ASSESSMENT:  CLINICAL IMPRESSION: Patient is a 52 y.o. F who was seen today for treatment of voice quality and voice use. See "today's treatment" for more details. She agrees with d/c today.   OBJECTIVE IMPAIRMENTS: include voice disorder. These impairments are limiting patient from ADLs/IADLs,  effectively communicating at home and in community, and while at work . Factors affecting potential to achieve goals and functional outcome are  her occupational needs using her voice . Patient will benefit from skilled SLP services to address above impairments and improve overall function.  REHAB POTENTIAL: Good  PLAN: D/C today  PLANNED INTERVENTIONS: 92507 Treatment of speech (30 or 45 min) , Internal/external aids, Functional tasks, SLP instruction and feedback, Compensatory strategies, Patient/family education, and voice exercises.    Carlon Chaloux, CCC-SLP 10/05/2023, 9:28 AM

## 2023-10-12 ENCOUNTER — Other Ambulatory Visit: Payer: Self-pay | Admitting: Family Medicine

## 2023-10-12 DIAGNOSIS — F4323 Adjustment disorder with mixed anxiety and depressed mood: Secondary | ICD-10-CM | POA: Diagnosis not present

## 2023-10-12 DIAGNOSIS — Z0000 Encounter for general adult medical examination without abnormal findings: Secondary | ICD-10-CM

## 2023-10-14 ENCOUNTER — Ambulatory Visit: Payer: BC Managed Care – PPO | Admitting: Radiology

## 2023-10-14 VITALS — BP 100/58 | HR 54 | Temp 98.1°F

## 2023-10-14 DIAGNOSIS — L988 Other specified disorders of the skin and subcutaneous tissue: Secondary | ICD-10-CM

## 2023-10-14 MED ORDER — NYSTATIN-TRIAMCINOLONE 100000-0.1 UNIT/GM-% EX OINT
1.0000 | TOPICAL_OINTMENT | Freq: Two times a day (BID) | CUTANEOUS | 0 refills | Status: AC
Start: 2023-10-14 — End: ?

## 2023-10-14 NOTE — Progress Notes (Signed)
   [name] [birth year] [id number]   History:  53 y.o. G2P2 presents with complaint of tender, red area, quarter sized on left breast. Noticed area 3 days ago. M 10/30/22   Gynecologic History Patient's last menstrual period was 10/03/2019.   Contraception/Family planning: post menopausal status Last mammogram: 10/2022. Results were: normal  Obstetric History OB History  Gravida Para Term Preterm AB Living  2 2    3   SAB IAB Ectopic Multiple Live Births     1 3    # Outcome Date GA Lbr Len/2nd Weight Sex Type Anes PTL Lv  2 Para           1A Para           1B Para              The following portions of the patient's history were reviewed and updated as appropriate: allergies, current medications, past family history, past medical history, past social history, past surgical history, and problem list.  Review of Systems Pertinent items noted in HPI and remainder of comprehensive ROS otherwise negative.   Past medical history, past surgical history, family history and social history were all reviewed and documented in the EPIC chart.   Exam:  Vitals:   10/14/23 1459  BP: (!) 100/58  Pulse: (!) 54  Temp: 98.1 F (36.7 C)  TempSrc: Oral  SpO2: 98%   There is no height or weight on file to calculate BMI.  Physical Exam Vitals and nursing note reviewed.  Constitutional:      Appearance: Normal appearance.  Chest:       Comments: 2x1cm erythematous flat skin change above right areola, consistent border, no scaling or bruising Neurological:     Mental Status: She is alert.  Psychiatric:        Mood and Affect: Mood normal.        Thought Content: Thought content normal.        Judgment: Judgment normal.     Darice Hoit, CMA present for exam  Assessment/Plan:   1. Skin lesion of breast (Primary) - nystatin -triamcinolone  ointment (MYCOLOG); Apply 1 Application topically 2 (two) times daily.  Dispense: 30 g; Refill: 0 Follow up with derm if no  improvement for biopsy Has mammogram scheduled at the end of the month  Sullivan Blasing B WHNP-BC 3:20 PM 10/14/2023

## 2023-10-26 DIAGNOSIS — F4323 Adjustment disorder with mixed anxiety and depressed mood: Secondary | ICD-10-CM | POA: Diagnosis not present

## 2023-10-27 DIAGNOSIS — I781 Nevus, non-neoplastic: Secondary | ICD-10-CM | POA: Diagnosis not present

## 2023-10-27 DIAGNOSIS — I788 Other diseases of capillaries: Secondary | ICD-10-CM | POA: Diagnosis not present

## 2023-11-02 ENCOUNTER — Ambulatory Visit: Payer: BC Managed Care – PPO

## 2023-11-06 ENCOUNTER — Other Ambulatory Visit: Payer: Self-pay | Admitting: Radiology

## 2023-11-06 DIAGNOSIS — Z79890 Hormone replacement therapy (postmenopausal): Secondary | ICD-10-CM

## 2023-11-09 DIAGNOSIS — F4323 Adjustment disorder with mixed anxiety and depressed mood: Secondary | ICD-10-CM | POA: Diagnosis not present

## 2023-11-09 NOTE — Telephone Encounter (Signed)
Med refill request: [facility] Last AEX: 09/16/2022-JC Next AEX: nothing scheduled, recall sent per EMR. Msg sent to the appt desk.  Last MMG (if hormonal med): 10/30/2022-WNL, scheduled for 11/11/2023. Refill authorized: rx pend.

## 2023-11-11 ENCOUNTER — Ambulatory Visit
Admission: RE | Admit: 2023-11-11 | Discharge: 2023-11-11 | Disposition: A | Discharge status: Home / Self Care | Payer: BC Managed Care – PPO | Source: Ambulatory Visit | Attending: Family Medicine | Admitting: Family Medicine

## 2023-11-11 DIAGNOSIS — Z1231 Encounter for screening mammogram for malignant neoplasm of breast: Secondary | ICD-10-CM | POA: Diagnosis not present

## 2023-11-11 DIAGNOSIS — Z0000 Encounter for general adult medical examination without abnormal findings: Secondary | ICD-10-CM

## 2023-11-18 ENCOUNTER — Other Ambulatory Visit: Payer: Self-pay

## 2023-11-18 DIAGNOSIS — Z79890 Hormone replacement therapy (postmenopausal): Secondary | ICD-10-CM

## 2023-11-18 MED ORDER — ESTRADIOL 0.1 MG/24HR TD PTTW: 1.0000 | MEDICATED_PATCH | TRANSDERMAL | 0 refills | Status: DC

## 2023-11-18 NOTE — Telephone Encounter (Signed)
Medication refill request: [facility] dot  Last AEX:  09/16/22 Next AEX: 12/11/23 Last MMG (if hormonal medication request): 11/16/23 bi-rads 1 neg  Refill authorized: patient requesting refill to get her to her aex. Please advise.

## 2023-11-18 NOTE — Telephone Encounter (Signed)
Medication refill request: [facility] dot  Last AEX:  09/16/22 Next AEX: 12/11/23 Last MMG (if hormonal medication request): 11/16/23 bi-rads 1 neg  Refill authorized: [facility].

## 2023-11-23 DIAGNOSIS — F4323 Adjustment disorder with mixed anxiety and depressed mood: Secondary | ICD-10-CM | POA: Diagnosis not present

## 2023-12-07 DIAGNOSIS — F4323 Adjustment disorder with mixed anxiety and depressed mood: Secondary | ICD-10-CM | POA: Diagnosis not present

## 2023-12-09 DIAGNOSIS — M170 Bilateral primary osteoarthritis of knee: Secondary | ICD-10-CM | POA: Diagnosis not present

## 2023-12-11 ENCOUNTER — Other Ambulatory Visit (HOSPITAL_COMMUNITY)
Admission: RE | Admit: 2023-12-11 | Discharge: 2023-12-11 | Disposition: A | Discharge status: Home / Self Care | Source: Ambulatory Visit | Attending: Radiology | Admitting: Radiology

## 2023-12-11 ENCOUNTER — Encounter: Payer: Self-pay | Admitting: Radiology

## 2023-12-11 ENCOUNTER — Ambulatory Visit (INDEPENDENT_AMBULATORY_CARE_PROVIDER_SITE_OTHER): Payer: BC Managed Care – PPO | Admitting: Radiology

## 2023-12-11 VITALS — BP 104/68 | HR 86 | Ht 64.0 in | Wt 143.0 lb

## 2023-12-11 DIAGNOSIS — Z01419 Encounter for gynecological examination (general) (routine) without abnormal findings: Secondary | ICD-10-CM | POA: Insufficient documentation

## 2023-12-11 DIAGNOSIS — Z79890 Hormone replacement therapy (postmenopausal): Secondary | ICD-10-CM | POA: Diagnosis not present

## 2023-12-11 DIAGNOSIS — Z1331 Encounter for screening for depression: Secondary | ICD-10-CM | POA: Diagnosis not present

## 2023-12-11 DIAGNOSIS — Z13820 Encounter for screening for osteoporosis: Secondary | ICD-10-CM

## 2023-12-11 MED ORDER — ESTRADIOL 0.1 MG/GM VA CREA: 1.0000 g | TOPICAL_CREAM | VAGINAL | 6 refills | Status: AC

## 2023-12-11 MED ORDER — PROGESTERONE 200 MG PO CAPS
200.0000 mg | ORAL_CAPSULE | Freq: Every day | ORAL | 4 refills | Status: AC
Start: 2023-12-11 — End: ?

## 2023-12-11 MED ORDER — ESTRADIOL 0.1 MG/24HR TD PTTW: 1.0000 | MEDICATED_PATCH | TRANSDERMAL | 4 refills | Status: AC

## 2023-12-11 MED ORDER — NONFORMULARY OR COMPOUNDED ITEM: 5 refills | Status: AC

## 2023-12-11 NOTE — Patient Instructions (Signed)
 Preventive Care 54-55 Years Old, 53 Years Old  Preventive care refers to lifestyle choices and visits with your health care provider that can promote health and wellness. Preventive care visits are also called wellness exams.  What can I expect for my preventive care visit?  Counseling  Your health care provider may ask you questions about your:  Medical history, including:  Past medical problems.  Family medical history.  Pregnancy history.  Current health, including:  Menstrual cycle.  Method of birth control.  Emotional well-being.  Home life and relationship well-being.  Sexual activity and sexual health.  Lifestyle, including:  Alcohol, nicotine or tobacco, and drug use.  Access to firearms.  Diet, exercise, and sleep habits.  Work and work Astronomer.  Sunscreen use.  Safety issues such as seatbelt and bike helmet use.  Physical exam  Your health care provider will check your:  Height and weight. These may be used to calculate your BMI (body mass index). BMI is a measurement that tells if you are at a healthy weight.  Waist circumference. This measures the distance around your waistline. This measurement also tells if you are at a healthy weight and may help predict your risk of certain diseases, such as type 2 diabetes and high blood pressure.  Heart rate and blood pressure.  Body temperature.  Skin for abnormal spots.  What immunizations do I need?    Vaccines are usually given at various ages, according to a schedule. Your health care provider will recommend vaccines for you based on your age, medical history, and lifestyle or other factors, such as travel or where you work.  What tests do I need?  Screening  Your health care provider may recommend screening tests for certain conditions. This may include:  Lipid and cholesterol levels.  Diabetes screening. This is done by checking your blood sugar (glucose) after you have not eaten for a while (fasting).  Pelvic exam and Pap test.  Hepatitis B test.  Hepatitis C  test.  HIV (human immunodeficiency virus) test.  STI (sexually transmitted infection) testing, if you are at risk.  Lung cancer screening.  Colorectal cancer screening.  Mammogram. Talk with your health care provider about when you should start having regular mammograms. This may depend on whether you have a family history of breast cancer.  BRCA-related cancer screening. This may be done if you have a family history of breast, ovarian, tubal, or peritoneal cancers.  Bone density scan. This is done to screen for osteoporosis.  Talk with your health care provider about your test results, treatment options, and if necessary, the need for more tests.  Follow these instructions at home:  Eating and drinking    Eat a diet that includes fresh fruits and vegetables, whole grains, lean protein, and low-fat dairy products.  Take vitamin and mineral supplements as recommended by your health care provider.  Do not drink alcohol if:  Your health care provider tells you not to drink.  You are pregnant, may be pregnant, or are planning to become pregnant.  If you drink alcohol:  Limit how much you have to 0-1 drink a day.  Know how much alcohol is in your drink. In the [place] one drink equals one 12 oz bottle of beer (355 mL), one 5 oz glass of wine (148 mL), or one 1 oz glass of hard liquor (44 mL).  Lifestyle  Brush your teeth every morning and night with fluoride toothpaste. Floss one time each day.  Exercise for at least  30 minutes 5 or more days each week.  Do not use any products that contain nicotine or tobacco. These products include cigarettes, chewing tobacco, and vaping devices, such as e-cigarettes. If you need help quitting, ask your health care provider.  Do not use drugs.  If you are sexually active, practice safe sex. Use a condom or other form of protection to prevent STIs.  If you do not wish to become pregnant, use a form of birth control. If you plan to become pregnant, see your health care provider for a  prepregnancy visit.  Take aspirin only as told by your health care provider. Make sure that you understand how much to take and what form to take. Work with your health care provider to find out whether it is safe and beneficial for you to take aspirin daily.  Find healthy ways to manage stress, such as:  Meditation, yoga, or listening to music.  Journaling.  Talking to a trusted person.  Spending time with friends and family.  Minimize exposure to UV radiation to reduce your risk of skin cancer.  Safety  Always wear your seat belt while driving or riding in a vehicle.  Do not drive:  If you have been drinking alcohol. Do not ride with someone who has been drinking.  When you are tired or distracted.  While texting.  If you have been using any mind-altering substances or drugs.  Wear a helmet and other protective equipment during sports activities.  If you have firearms in your house, make sure you follow all gun safety procedures.  Seek help if you have been physically or sexually abused.  What's next?  Visit your health care provider once a year for an annual wellness visit.  Ask your health care provider how often you should have your eyes and teeth checked.  Stay up to date on all vaccines.  This information is not intended to replace advice given to you by your health care provider. Make sure you discuss any questions you have with your health care provider.  Document Revised: 03/20/2021 Document Reviewed: 03/20/2021  Elsevier Patient Education  2024 [employer].

## 2023-12-11 NOTE — Progress Notes (Signed)
 [name] 08-29-1971 [id number]   History: Postmenopausal 53 y.o. presents for annual exam. Doing well on HRT.   Gynecologic History Postmenopausal Last Pap: 2020. Results were: normal Last mammogram: 2025. Results were: normal Last colonoscopy: 2023 DEXA:never HRT use: current  Obstetric History OB History  Gravida Para Term Preterm AB Living  2 2    3   SAB IAB Ectopic Multiple Live Births     1 3    # Outcome Date GA Lbr Len/2nd Weight Sex Type Anes PTL Lv  2 Para           1A Para           1B Para                12/11/2023   10:37 AM  Depression screen PHQ 2/9  Decreased Interest 0  Down, Depressed, Hopeless 0  PHQ - 2 Score 0     The following portions of the patient's history were reviewed and updated as appropriate: allergies, current medications, past family history, past medical history, past social history, past surgical history, and problem list.  Review of Systems Pertinent items noted in HPI and remainder of comprehensive ROS otherwise negative.  Past medical history, past surgical history, family history and social history were all reviewed and documented in the EPIC chart.  Exam:  Vitals:   12/11/23 1035  BP: 104/68  Pulse: 86  SpO2: 96%  Weight: 143 lb (64.9 kg)  Height: 5\' 4"  (1.626 m)   Body mass index is 24.55 kg/m.  General appearance:  Normal Thyroid:  Symmetrical, normal in size, without palpable masses or nodularity. Respiratory  Auscultation:  Clear without wheezing or rhonchi Cardiovascular  Auscultation:  Regular rate, without rubs, murmurs or gallops  Edema/varicosities:  Not grossly evident Abdominal  Soft,nontender, without masses, guarding or rebound.  Liver/spleen:  No organomegaly noted  Hernia:  None appreciated  Skin  Inspection:  Grossly normal Breasts: Examined lying and sitting.   Right: Without masses, retractions, nipple discharge or axillary adenopathy.   Left: Without masses, retractions,  nipple discharge or axillary adenopathy. Genitourinary   Inguinal/mons:  Normal without inguinal adenopathy  External genitalia:  Normal appearing vulva with no masses, tenderness, or lesions  BUS/Urethra/Skene's glands:  Normal  Vagina:  Normal appearing with normal color and discharge, no lesions. Atrophy: mild   Cervix:  Normal appearing without discharge or lesions  Uterus:  Normal in size, shape and contour.  Midline and mobile, nontender  Adnexa/parametria:     Rt: Normal in size, without masses or tenderness.   Lt: Normal in size, without masses or tenderness.  Anus and perineum: Normal    [name], CMA present for exam  Assessment/Plan:   1. Well woman exam with routine gynecological exam (Primary) - Cytology - PAP( [facility])  2. Hormone replacement therapy (HRT) - estradiol (VIVELLE-DOT) 0.1 MG/24HR patch; Place 1 patch (0.1 mg total) onto the skin 2 (two) times a week.  Dispense: 24 patch; Refill: 4 - progesterone (PROMETRIUM) 200 MG capsule; Take 1 capsule (200 mg total) by mouth daily. See admin instructions.  Dispense: 90 capsule; Refill: 4 - estradiol (ESTRACE VAGINAL) 0.1 MG/GM vaginal cream; Place 1 g vaginally 3 (three) times a week.  Dispense: 42.5 g; Refill: 6  3. Screening for osteoporosis - DG Bone Density; Future    Discussed SBE, colonoscopy and DEXA screening as directed. Recommend of exercise weekly, including weight bearing exercise. Encouraged the use of seatbelts and  sunscreen.  Return in 1 year for annual or sooner prn.  [name] B WHNP-BC, 11:54 AM 12/11/2023

## 2023-12-11 NOTE — Addendum Note (Signed)
 Addended by: Windle Guard on: 12/11/2023 12:03 PM   Modules accepted: Orders

## 2023-12-16 ENCOUNTER — Other Ambulatory Visit: Payer: Self-pay | Admitting: Radiology

## 2023-12-16 DIAGNOSIS — B3731 Acute candidiasis of vulva and vagina: Secondary | ICD-10-CM

## 2023-12-16 DIAGNOSIS — M170 Bilateral primary osteoarthritis of knee: Secondary | ICD-10-CM | POA: Diagnosis not present

## 2023-12-16 LAB — CYTOLOGY - PAP
Adequacy: ABSENT
Comment: NEGATIVE
Diagnosis: NEGATIVE
High risk HPV: NEGATIVE

## 2023-12-16 MED ORDER — FLUCONAZOLE 150 MG PO TABS: 150.0000 mg | ORAL_TABLET | ORAL | 0 refills | Status: AC

## 2023-12-21 ENCOUNTER — Ambulatory Visit (HOSPITAL_BASED_OUTPATIENT_CLINIC_OR_DEPARTMENT_OTHER)
Admission: RE | Admit: 2023-12-21 | Discharge: 2023-12-21 | Disposition: A | Discharge status: Home / Self Care | Source: Ambulatory Visit | Attending: Radiology | Admitting: Radiology

## 2023-12-21 DIAGNOSIS — Z13820 Encounter for screening for osteoporosis: Secondary | ICD-10-CM | POA: Diagnosis not present

## 2023-12-21 DIAGNOSIS — M47816 Spondylosis without myelopathy or radiculopathy, lumbar region: Secondary | ICD-10-CM | POA: Diagnosis not present

## 2023-12-23 DIAGNOSIS — E039 Hypothyroidism, unspecified: Secondary | ICD-10-CM | POA: Diagnosis not present

## 2023-12-23 DIAGNOSIS — R944 Abnormal results of kidney function studies: Secondary | ICD-10-CM | POA: Diagnosis not present

## 2023-12-23 DIAGNOSIS — Z0000 Encounter for general adult medical examination without abnormal findings: Secondary | ICD-10-CM | POA: Diagnosis not present

## 2023-12-23 DIAGNOSIS — Z13220 Encounter for screening for lipoid disorders: Secondary | ICD-10-CM | POA: Diagnosis not present

## 2023-12-23 DIAGNOSIS — F411 Generalized anxiety disorder: Secondary | ICD-10-CM | POA: Diagnosis not present

## 2023-12-23 DIAGNOSIS — M170 Bilateral primary osteoarthritis of knee: Secondary | ICD-10-CM | POA: Diagnosis not present

## 2023-12-23 DIAGNOSIS — Z23 Encounter for immunization: Secondary | ICD-10-CM | POA: Diagnosis not present

## 2023-12-28 DIAGNOSIS — F4323 Adjustment disorder with mixed anxiety and depressed mood: Secondary | ICD-10-CM | POA: Diagnosis not present

## 2024-01-04 DIAGNOSIS — F4323 Adjustment disorder with mixed anxiety and depressed mood: Secondary | ICD-10-CM | POA: Diagnosis not present

## 2024-01-18 DIAGNOSIS — F4323 Adjustment disorder with mixed anxiety and depressed mood: Secondary | ICD-10-CM | POA: Diagnosis not present

## 2024-02-15 DIAGNOSIS — F4323 Adjustment disorder with mixed anxiety and depressed mood: Secondary | ICD-10-CM | POA: Diagnosis not present

## 2024-03-08 DIAGNOSIS — D2261 Melanocytic nevi of right upper limb, including shoulder: Secondary | ICD-10-CM | POA: Diagnosis not present

## 2024-03-08 DIAGNOSIS — D2262 Melanocytic nevi of left upper limb, including shoulder: Secondary | ICD-10-CM | POA: Diagnosis not present

## 2024-03-08 DIAGNOSIS — L812 Freckles: Secondary | ICD-10-CM | POA: Diagnosis not present

## 2024-03-08 DIAGNOSIS — D225 Melanocytic nevi of trunk: Secondary | ICD-10-CM | POA: Diagnosis not present

## 2024-03-10 ENCOUNTER — Encounter: Payer: Self-pay | Admitting: Allergy

## 2024-03-10 ENCOUNTER — Ambulatory Visit: Payer: BC Managed Care – PPO | Admitting: Allergy

## 2024-03-10 ENCOUNTER — Other Ambulatory Visit: Payer: Self-pay

## 2024-03-10 VITALS — BP 102/80 | HR 61 | Temp 98.0°F | Resp 18 | Ht 64.57 in | Wt 144.6 lb

## 2024-03-10 DIAGNOSIS — J310 Chronic rhinitis: Secondary | ICD-10-CM | POA: Diagnosis not present

## 2024-03-10 DIAGNOSIS — R0982 Postnasal drip: Secondary | ICD-10-CM | POA: Diagnosis not present

## 2024-03-10 MED ORDER — CETIRIZINE HCL 10 MG PO CHEW: 10.0000 mg | CHEWABLE_TABLET | Freq: Every day | ORAL | 3 refills | Status: AC

## 2024-03-10 MED ORDER — FLUTICASONE PROPIONATE 50 MCG/ACT NA SUSP: 2.0000 | Freq: Every day | NASAL | 1 refills | Status: AC

## 2024-03-10 MED ORDER — MONTELUKAST SODIUM 10 MG PO TABS: 10.0000 mg | ORAL_TABLET | Freq: Every day | ORAL | 3 refills | Status: AC

## 2024-03-10 NOTE — Patient Instructions (Addendum)
 Rhinitis with post-nasal drip - symptoms improved -Continue Singulair and Zyrtec daily.   Discussed if wanting to use Zyrtec as needed during winter months would taper off to prevent possible rebound itch with stopping abruptly.   -Continue Flonase nasal spray 1-2 sprays times daily for nasal symptom control.    -Continue nasal saline flushes.  Use saline rinse kit prior to nasal spray use.  Use distilled water or boil water and bring to room temperature (do not use tap water).  Keep mouth open during entirety of rinse.   - Continue avoidance measures for grasses, weeds, trees, outdoor molds, dust mites, and mixed feathers - Consider allergy  shots as a means of long-term control. - Allergy  shots "re-train" and "reset" the immune system to ignore environmental allergens and decrease the resulting immune response to those allergens (sneezing, itchy watery eyes, runny nose, nasal congestion, etc).    - Allergy  shots improve symptoms in 75-85% of patients.  - We can discuss more at future appointment if the medications are not working for you.  Follow-up in 12 months or sooner if needed

## 2024-03-10 NOTE — Progress Notes (Signed)
 Follow-up Note  RE: [name] MRN: [id number] DOB: 06-30-1971 Date of Office Visit: 03/10/2024   History of present illness: [name] is a 53 y.o. female presenting today for follow-up of rhinitis with postnasal drip.  She was last seen in the office on 09/10/2023 by myself. Discussed the use of AI scribe software for clinical note transcription with the patient, who gave verbal consent to proceed.  She has a history of allergies to various pollens, including trees, grass, and weeds, which are present year-round and seasonally. This spring season was much better than previous years, with symptoms being manageable. She attributes this improvement to her adherence to environmental control measures such as modifying bedding and household practices.  She experienced issues with Ryaltris , which caused increased nasal drainage and coughing. She switched back to using Flonase, which she finds effective. She uses one spray in each nostril nightly. She also takes Zyrtec in the morning and Singulair at night for allergy  management.  She performs nasal rinses with a neti pot every morning, which she finds beneficial. She uses distilled water for the rinses.   She has not had any major health changes, symptoms or hospitalization since last visit.  Review of systems: 10pt ROS negative unless noted above in HPI  Past medical/social/surgical/family history have been reviewed and are unchanged unless specifically indicated below.  No changes  Medication List: Current Outpatient Medications  Medication Sig Dispense Refill   cetirizine (ZYRTEC) 10 MG chewable tablet Chew 10 mg by mouth daily.     Cholecalciferol (VITAMIN D) 50 MCG (2000 UT) CAPS      escitalopram  (LEXAPRO ) 10 MG tablet Take 5 mg by mouth daily. Takes 1/2 10 mg daily     estradiol  (ESTRACE  VAGINAL) 0.1 MG/GM vaginal cream Place 1 g vaginally 3 (three) times a week. 42.5 g 6   estradiol  (VIVELLE -DOT) 0.1  MG/24HR patch Place 1 patch (0.1 mg total) onto the skin 2 (two) times a week. 24 patch 4   fluconazole  (DIFLUCAN ) 150 MG tablet Take 1 tablet (150 mg total) by mouth every 3 (three) days. 2 tablet 0   fluticasone (FLONASE) 50 MCG/ACT nasal spray Place 2 sprays into both nostrils daily. BID morning and night     Magnesium Gluconate (MAGNESIUM 27 PO) 500mg      montelukast (SINGULAIR) 10 MG tablet Take 10 mg by mouth at bedtime. At night     NONFORMULARY OR COMPOUNDED ITEM Testosterone  PLO or amyhourous base 4%  #30 gms Apply a pea size amount at bedtime to the inner thigh. 2 refills 30 each 2   NONFORMULARY OR COMPOUNDED ITEM Compounded Testosterone  PLO GEL or Anhydrous versabase 4%, apply pea sized amount to inner thigh at bedtime 30 grams with 5 refills 30 each 5   nystatin -triamcinolone  ointment (MYCOLOG) Apply 1 Application topically 2 (two) times daily. (Patient not taking: Reported on 12/11/2023) 30 g 0   Olopatadine-Mometasone (RYALTRIS ) 665-25 MCG/ACT SUSP Place 2 sprays into the nose 2 (two) times daily as needed (nasal drainage or congestion). (Patient not taking: Reported on 12/11/2023) 29 g 5   Probiotic Product (PROBIOTIC PO) Take by mouth.     progesterone  (PROMETRIUM ) 200 MG capsule Take 1 capsule (200 mg total) by mouth daily. See admin instructions. 90 capsule 4   thyroid  (NP THYROID ) 15 MG tablet Take 1 tablet (15 mg total) by mouth daily. 90 tablet 4   No current facility-administered medications for this visit.     Known medication allergies: No  Known Allergies   Physical examination: Blood pressure 102/80, pulse 61, temperature 98 F (36.7 C), temperature source Temporal, resp. rate 18, height 5' 4.57" (1.64 m), weight 144 lb 9.6 oz (65.6 kg), last menstrual period 10/03/2019, SpO2 98%.  General: Alert, interactive, in no acute distress. HEENT: PERRLA, TMs pearly gray, turbinates non-edematous without discharge, post-pharynx non erythematous. Neck: Supple without  lymphadenopathy. Lungs: Clear to auscultation without wheezing, rhonchi or rales. {no increased work of breathing. CV: Normal S1, S2 without murmurs. Abdomen: Nondistended, nontender. Skin: Warm and dry, without lesions or rashes. Extremities:  No clubbing, cyanosis or edema. Neuro:   Grossly intact.  Diagnostics/Labs: None today  Assessment and plan:   Rhinitis with post-nasal drip - symptoms improved -Continue Singulair and Zyrtec daily.   Discussed if wanting to use Zyrtec as needed during winter months would taper off to prevent possible rebound itch with stopping abruptly.   -Continue Flonase nasal spray 1-2 sprays times daily for nasal symptom control.    -Continue nasal saline flushes.  Use saline rinse kit prior to nasal spray use.  Use distilled water or boil water and bring to room temperature (do not use tap water).  Keep mouth open during entirety of rinse.   - Continue avoidance measures for grasses, weeds, trees, outdoor molds, dust mites, and mixed feathers - Consider allergy  shots as a means of long-term control. - Allergy  shots "re-train" and "reset" the immune system to ignore environmental allergens and decrease the resulting immune response to those allergens (sneezing, itchy watery eyes, runny nose, nasal congestion, etc).    - Allergy  shots improve symptoms in 75-85% of patients.  - We can discuss more at future appointment if the medications are not working for you.  Follow-up in 12 months or sooner if needed  I appreciate the opportunity to take part in [name]'s care. Please do not hesitate to contact me with questions.  Sincerely,   Catha Clink, MD Allergy /Immunology [facility]

## 2024-03-11 DIAGNOSIS — F4323 Adjustment disorder with mixed anxiety and depressed mood: Secondary | ICD-10-CM | POA: Diagnosis not present

## 2024-03-21 DIAGNOSIS — F4323 Adjustment disorder with mixed anxiety and depressed mood: Secondary | ICD-10-CM | POA: Diagnosis not present

## 2024-03-29 DIAGNOSIS — M1711 Unilateral primary osteoarthritis, right knee: Secondary | ICD-10-CM | POA: Diagnosis not present

## 2024-04-01 DIAGNOSIS — Z860101 Personal history of adenomatous and serrated colon polyps: Secondary | ICD-10-CM | POA: Diagnosis not present

## 2024-04-01 DIAGNOSIS — Z09 Encounter for follow-up examination after completed treatment for conditions other than malignant neoplasm: Secondary | ICD-10-CM | POA: Diagnosis not present

## 2024-04-01 DIAGNOSIS — D123 Benign neoplasm of transverse colon: Secondary | ICD-10-CM | POA: Diagnosis not present

## 2024-04-01 DIAGNOSIS — K5730 Diverticulosis of large intestine without perforation or abscess without bleeding: Secondary | ICD-10-CM | POA: Diagnosis not present

## 2024-04-01 DIAGNOSIS — K648 Other hemorrhoids: Secondary | ICD-10-CM | POA: Diagnosis not present

## 2024-04-11 DIAGNOSIS — F4323 Adjustment disorder with mixed anxiety and depressed mood: Secondary | ICD-10-CM | POA: Diagnosis not present

## 2024-05-02 DIAGNOSIS — F4323 Adjustment disorder with mixed anxiety and depressed mood: Secondary | ICD-10-CM | POA: Diagnosis not present

## 2024-05-16 DIAGNOSIS — F4323 Adjustment disorder with mixed anxiety and depressed mood: Secondary | ICD-10-CM | POA: Diagnosis not present

## 2024-05-18 ENCOUNTER — Other Ambulatory Visit: Payer: Self-pay | Admitting: Radiology

## 2024-05-18 DIAGNOSIS — R5382 Chronic fatigue, unspecified: Secondary | ICD-10-CM

## 2024-05-18 NOTE — Progress Notes (Signed)
 Patient called c/o worsening fatigue, feels like she needs to nap every afternoon. Would like to come in for labs. Visit scheduled, labs orders entered.

## 2024-05-19 ENCOUNTER — Other Ambulatory Visit

## 2024-05-19 DIAGNOSIS — R5382 Chronic fatigue, unspecified: Secondary | ICD-10-CM | POA: Diagnosis not present

## 2024-05-20 ENCOUNTER — Ambulatory Visit: Payer: Self-pay | Admitting: Radiology

## 2024-05-24 LAB — CBC
HCT: 39.6 % (ref 35.0–45.0)
Hemoglobin: 12.7 g/dL (ref 11.7–15.5)
MCH: 31.4 pg (ref 27.0–33.0)
MCHC: 32.1 g/dL (ref 32.0–36.0)
MCV: 97.8 fL (ref 80.0–100.0)
MPV: 10.2 fL (ref 7.5–12.5)
Platelets: 245 Thousand/uL (ref 140–400)
RBC: 4.05 Million/uL (ref 3.80–5.10)
RDW: 12.6 % (ref 11.0–15.0)
WBC: 5.3 Thousand/uL (ref 3.8–10.8)

## 2024-05-24 LAB — THYROID PANEL WITH TSH
Free Thyroxine Index: 2 (ref 1.4–3.8)
T3 Uptake: 31 % (ref 22–35)
T4, Total: 6.5 ug/dL (ref 5.1–11.9)
TSH: 2.2 m[IU]/L

## 2024-05-24 LAB — VITAMIN D 25 HYDROXY (VIT D DEFICIENCY, FRACTURES): Vit D, 25-Hydroxy: 67 ng/mL (ref 30–100)

## 2024-05-24 LAB — IRON,TIBC AND FERRITIN PANEL
%SAT: 41 % (ref 16–45)
Ferritin: 37 ng/mL (ref 16–232)
Iron: 126 ug/dL (ref 45–160)
TIBC: 304 ug/dL (ref 250–450)

## 2024-05-24 LAB — TESTOS,TOTAL,FREE AND SHBG (FEMALE)
Free Testosterone: 23 pg/mL — ABNORMAL HIGH (ref 0.1–6.4)
Sex Hormone Binding: 70 nmol/L (ref 17–124)
Testosterone, Total, LC-MS-MS: 239 ng/dL — ABNORMAL HIGH (ref 2–45)

## 2024-05-26 DIAGNOSIS — F4323 Adjustment disorder with mixed anxiety and depressed mood: Secondary | ICD-10-CM | POA: Diagnosis not present

## 2024-05-30 DIAGNOSIS — F4323 Adjustment disorder with mixed anxiety and depressed mood: Secondary | ICD-10-CM | POA: Diagnosis not present

## 2024-06-27 DIAGNOSIS — F4323 Adjustment disorder with mixed anxiety and depressed mood: Secondary | ICD-10-CM | POA: Diagnosis not present

## 2024-07-29 DIAGNOSIS — D23122 Other benign neoplasm of skin of left lower eyelid, including canthus: Secondary | ICD-10-CM | POA: Diagnosis not present

## 2024-07-29 DIAGNOSIS — H5213 Myopia, bilateral: Secondary | ICD-10-CM | POA: Diagnosis not present

## 2024-08-02 DIAGNOSIS — E039 Hypothyroidism, unspecified: Secondary | ICD-10-CM | POA: Diagnosis not present

## 2024-08-02 DIAGNOSIS — F411 Generalized anxiety disorder: Secondary | ICD-10-CM | POA: Diagnosis not present

## 2024-08-02 DIAGNOSIS — R944 Abnormal results of kidney function studies: Secondary | ICD-10-CM | POA: Diagnosis not present

## 2024-08-02 DIAGNOSIS — N951 Menopausal and female climacteric states: Secondary | ICD-10-CM | POA: Diagnosis not present

## 2024-08-08 DIAGNOSIS — F411 Generalized anxiety disorder: Secondary | ICD-10-CM | POA: Diagnosis not present

## 2024-08-12 DIAGNOSIS — R109 Unspecified abdominal pain: Secondary | ICD-10-CM | POA: Diagnosis not present

## 2024-08-22 DIAGNOSIS — F411 Generalized anxiety disorder: Secondary | ICD-10-CM | POA: Diagnosis not present

## 2024-09-05 DIAGNOSIS — F411 Generalized anxiety disorder: Secondary | ICD-10-CM | POA: Diagnosis not present

## 2024-09-19 DIAGNOSIS — F411 Generalized anxiety disorder: Secondary | ICD-10-CM | POA: Diagnosis not present

## 2025-03-16 ENCOUNTER — Ambulatory Visit: Admitting: Allergy
